# Patient Record
Sex: Female | Born: 1955 | Race: White | Hispanic: No | Smoking: Never smoker
Health system: Southern US, Community
[De-identification: ages and names within clinical notes are randomized; demographics above are authoritative.]

## PROBLEM LIST (undated history)

## (undated) DIAGNOSIS — F0280 Dementia in other diseases classified elsewhere without behavioral disturbance: Secondary | ICD-10-CM

## (undated) DIAGNOSIS — F909 Attention-deficit hyperactivity disorder, unspecified type: Secondary | ICD-10-CM

## (undated) DIAGNOSIS — E039 Hypothyroidism, unspecified: Secondary | ICD-10-CM

## (undated) DIAGNOSIS — R339 Retention of urine, unspecified: Secondary | ICD-10-CM

## (undated) DIAGNOSIS — G309 Alzheimer's disease, unspecified: Secondary | ICD-10-CM

## (undated) HISTORY — PX: APPENDECTOMY: SHX54

## (undated) HISTORY — DX: Dementia in other diseases classified elsewhere, unspecified severity, without behavioral disturbance, psychotic disturbance, mood disturbance, and anxiety: F02.80

## (undated) HISTORY — DX: Attention-deficit hyperactivity disorder, unspecified type: F90.9

## (undated) HISTORY — DX: Dementia in other diseases classified elsewhere, unspecified severity, without behavioral disturbance, psychotic disturbance, mood disturbance, and anxiety: G30.9

---

## 2000-02-27 ENCOUNTER — Encounter: Payer: Self-pay | Admitting: Endocrinology

## 2000-02-27 ENCOUNTER — Encounter: Admission: RE | Admit: 2000-02-27 | Discharge: 2000-02-27 | Payer: Self-pay | Admitting: Endocrinology

## 2001-03-06 ENCOUNTER — Encounter: Admission: RE | Admit: 2001-03-06 | Discharge: 2001-03-06 | Payer: Self-pay | Admitting: Endocrinology

## 2001-03-06 ENCOUNTER — Encounter: Payer: Self-pay | Admitting: Endocrinology

## 2002-02-28 ENCOUNTER — Encounter: Payer: Self-pay | Admitting: Endocrinology

## 2002-02-28 ENCOUNTER — Encounter: Admission: RE | Admit: 2002-02-28 | Discharge: 2002-02-28 | Payer: Self-pay | Admitting: Endocrinology

## 2002-11-28 ENCOUNTER — Encounter: Payer: Self-pay | Admitting: Endocrinology

## 2002-11-28 ENCOUNTER — Encounter: Admission: RE | Admit: 2002-11-28 | Discharge: 2002-11-28 | Payer: Self-pay | Admitting: Endocrinology

## 2003-02-03 ENCOUNTER — Other Ambulatory Visit: Admission: RE | Admit: 2003-02-03 | Discharge: 2003-02-03 | Payer: Self-pay | Admitting: Endocrinology

## 2004-12-26 ENCOUNTER — Encounter: Admission: RE | Admit: 2004-12-26 | Discharge: 2004-12-26 | Payer: Self-pay | Admitting: Endocrinology

## 2006-12-04 ENCOUNTER — Encounter: Admission: RE | Admit: 2006-12-04 | Discharge: 2006-12-04 | Payer: Self-pay | Admitting: Internal Medicine

## 2006-12-05 ENCOUNTER — Other Ambulatory Visit: Admission: RE | Admit: 2006-12-05 | Discharge: 2006-12-05 | Payer: Self-pay | Admitting: Obstetrics and Gynecology

## 2006-12-07 ENCOUNTER — Encounter: Admission: RE | Admit: 2006-12-07 | Discharge: 2006-12-07 | Payer: Self-pay | Admitting: Internal Medicine

## 2007-05-02 ENCOUNTER — Encounter: Admission: RE | Admit: 2007-05-02 | Discharge: 2007-05-02 | Payer: Self-pay | Admitting: Endocrinology

## 2009-08-03 ENCOUNTER — Encounter: Admission: RE | Admit: 2009-08-03 | Discharge: 2009-08-03 | Payer: Self-pay | Admitting: Internal Medicine

## 2010-02-24 ENCOUNTER — Other Ambulatory Visit: Admission: RE | Admit: 2010-02-24 | Discharge: 2010-02-24 | Payer: Self-pay | Admitting: Registered Nurse

## 2010-08-03 ENCOUNTER — Other Ambulatory Visit: Payer: Self-pay | Admitting: Internal Medicine

## 2010-08-03 DIAGNOSIS — Z1231 Encounter for screening mammogram for malignant neoplasm of breast: Secondary | ICD-10-CM

## 2010-08-05 ENCOUNTER — Ambulatory Visit
Admission: RE | Admit: 2010-08-05 | Discharge: 2010-08-05 | Disposition: A | Discharge status: Home / Self Care | Payer: BC Managed Care – PPO | Source: Ambulatory Visit | Attending: Internal Medicine | Admitting: Internal Medicine

## 2010-08-05 DIAGNOSIS — Z1231 Encounter for screening mammogram for malignant neoplasm of breast: Secondary | ICD-10-CM

## 2011-07-18 ENCOUNTER — Other Ambulatory Visit: Payer: Self-pay | Admitting: Internal Medicine

## 2011-07-18 DIAGNOSIS — Z1231 Encounter for screening mammogram for malignant neoplasm of breast: Secondary | ICD-10-CM

## 2011-08-11 ENCOUNTER — Ambulatory Visit
Admission: RE | Admit: 2011-08-11 | Discharge: 2011-08-11 | Disposition: A | Discharge status: Home / Self Care | Payer: BC Managed Care – PPO | Source: Ambulatory Visit | Attending: Internal Medicine | Admitting: Internal Medicine

## 2011-08-11 DIAGNOSIS — Z1231 Encounter for screening mammogram for malignant neoplasm of breast: Secondary | ICD-10-CM

## 2013-03-04 ENCOUNTER — Other Ambulatory Visit (HOSPITAL_COMMUNITY)
Admission: RE | Admit: 2013-03-04 | Discharge: 2013-03-04 | Disposition: A | Discharge status: Home / Self Care | Payer: BC Managed Care – PPO | Source: Ambulatory Visit | Attending: Internal Medicine | Admitting: Internal Medicine

## 2013-03-04 DIAGNOSIS — Z01419 Encounter for gynecological examination (general) (routine) without abnormal findings: Secondary | ICD-10-CM | POA: Insufficient documentation

## 2013-05-04 IMAGING — MG MM DIGITAL SCREENING BILAT W/ CAD
5 series · 5 of 5 positions shown · non-contrast
Comparison: none

DG SCREEN MAMMOGRAM BILATERAL
Bilateral CC and MLO view(s) were taken.
Technologist: Caleb Aujla

DIGITAL SCREENING MAMMOGRAM WITH CAD:
There are scattered fibroglandular densities.  No masses or malignant type calcifications are 
identified.  Compared with prior studies.
Images were processed with CAD.

[R CC (1 of 2)]
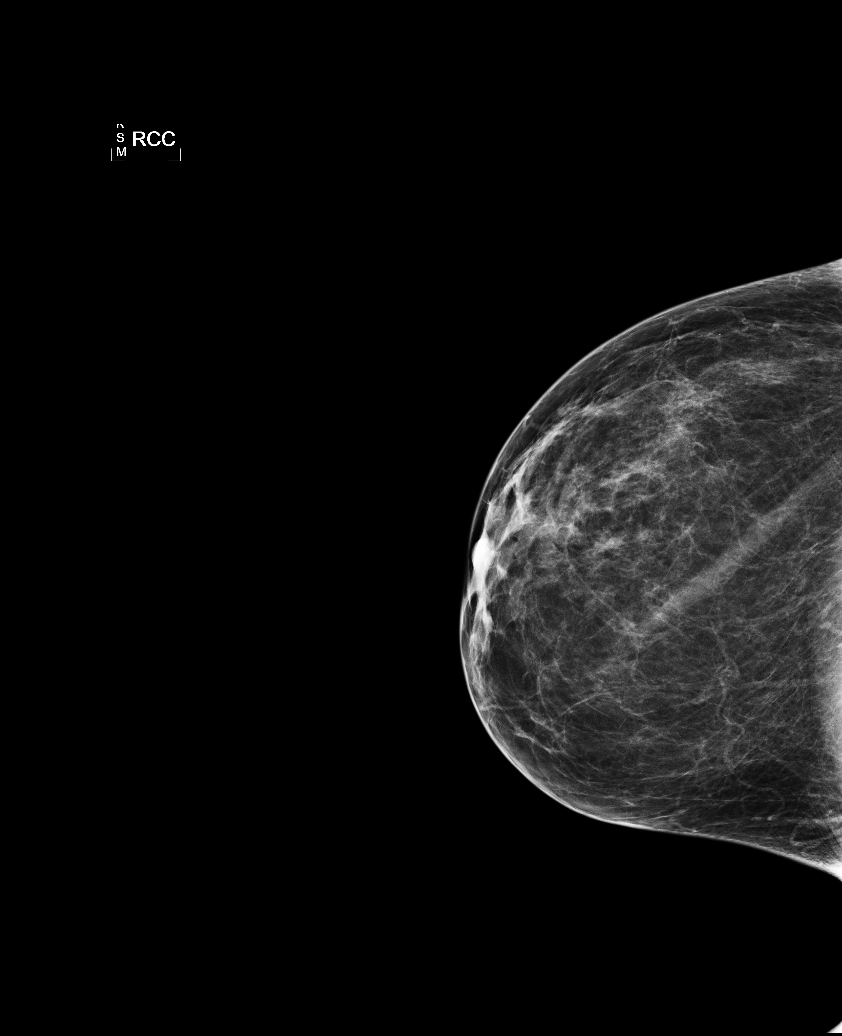

[L CC]
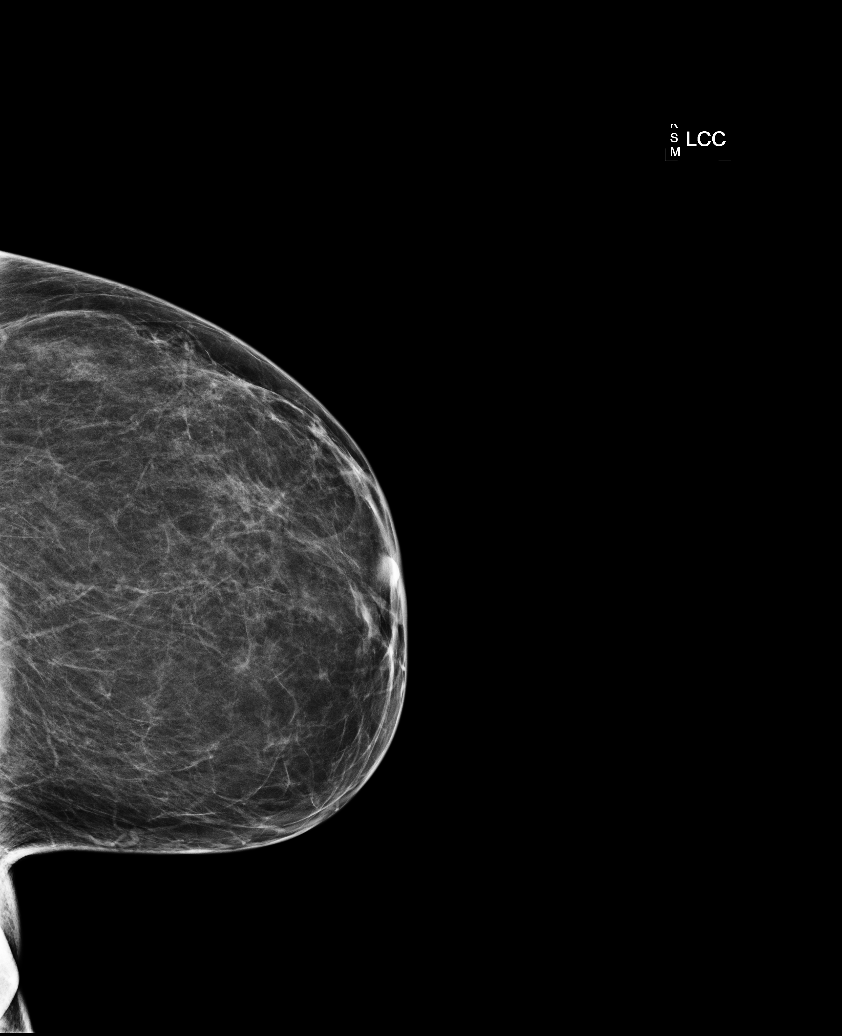

[L MLO]
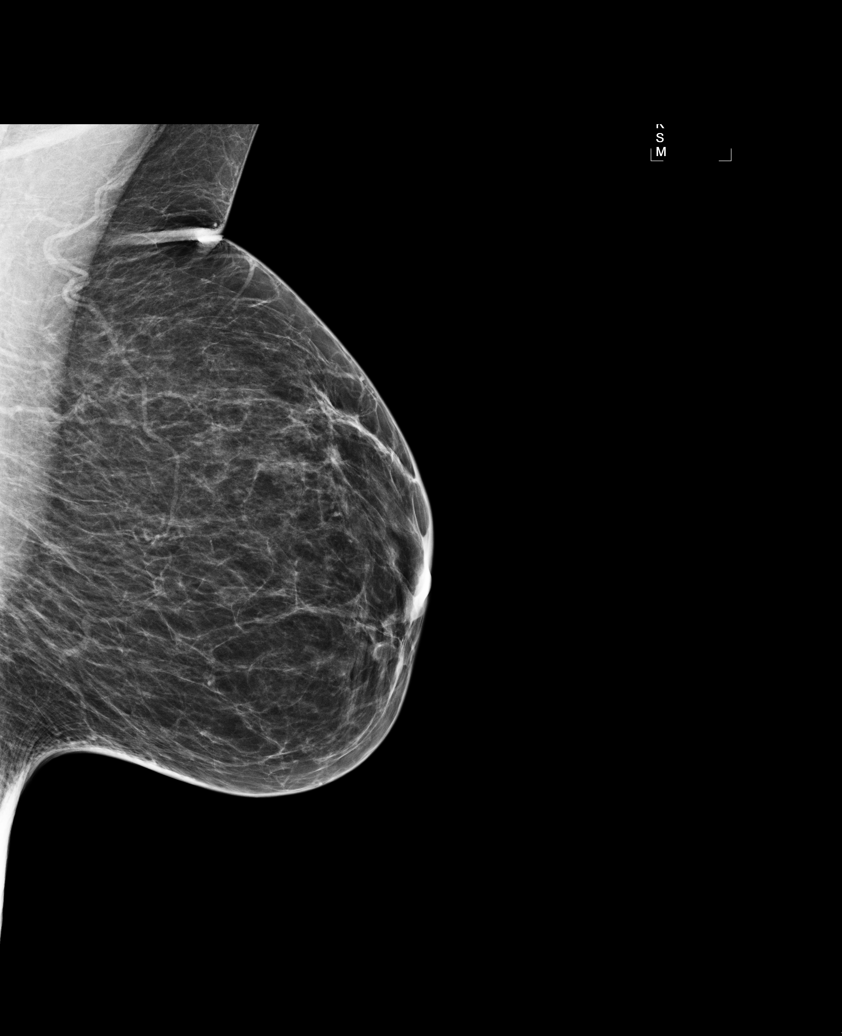

[R MLO]
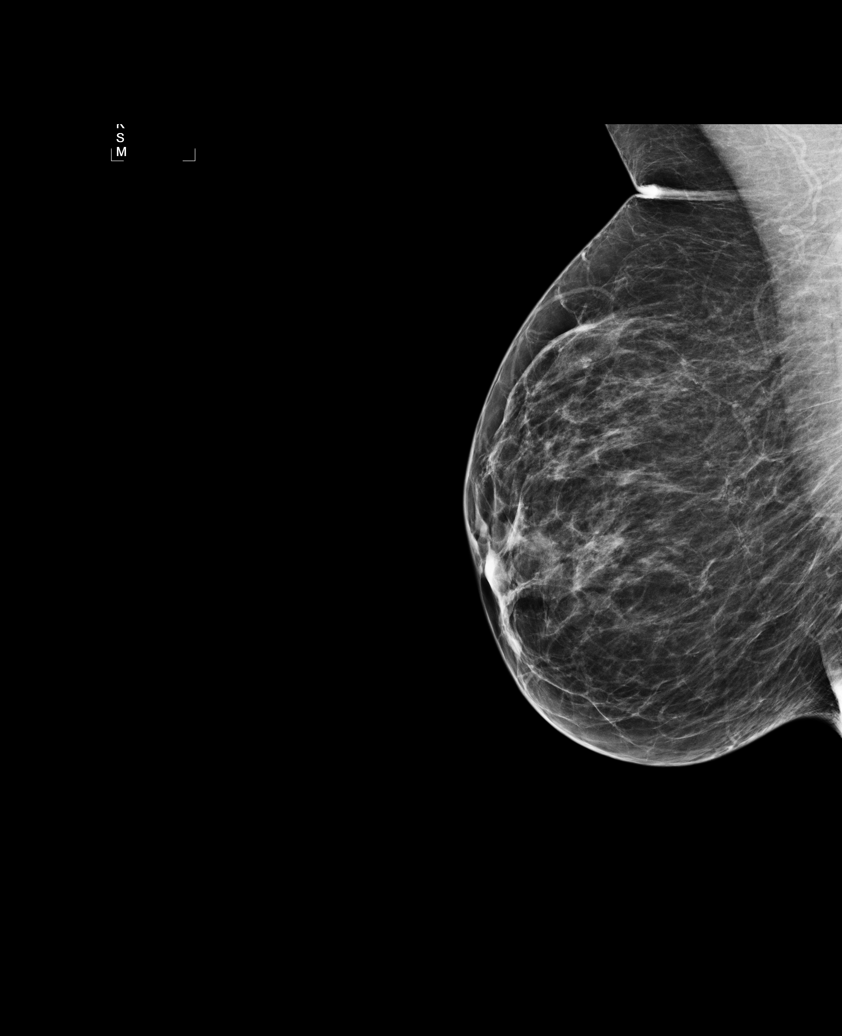

[R CC (2 of 2)]
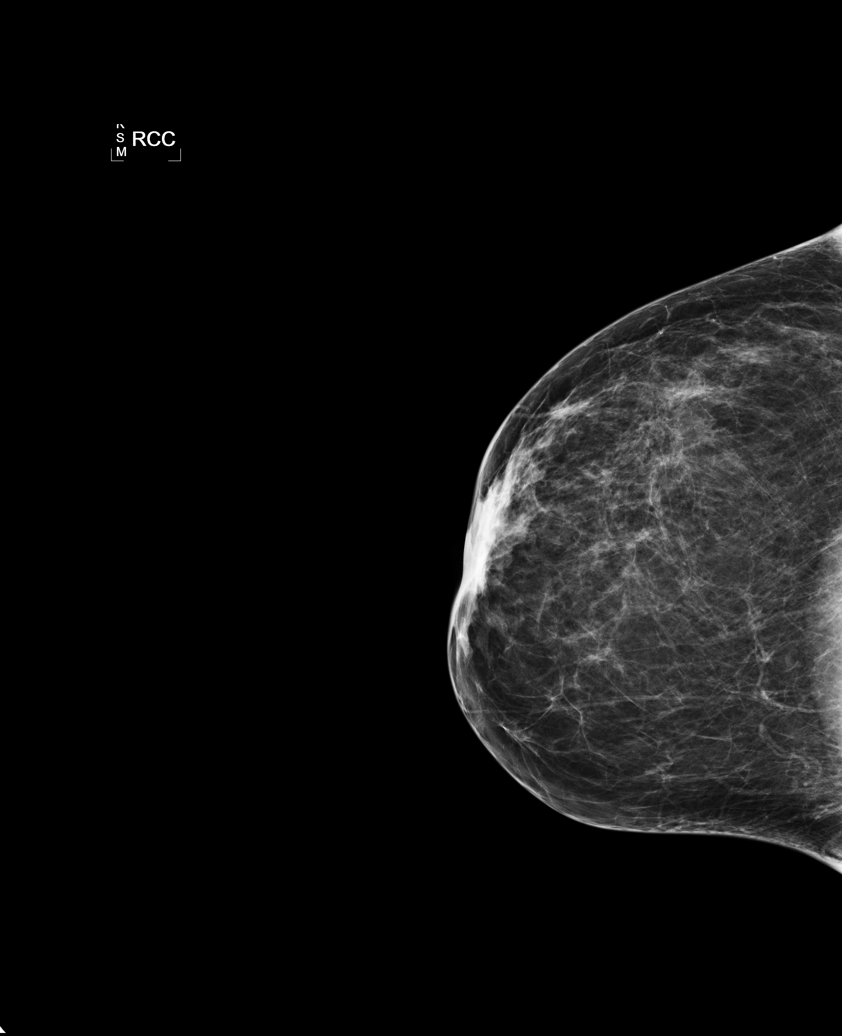

[5 of 5 positions shown; findings below may reference images not displayed]

IMPRESSION: No specific mammographic evidence of malignancy.  Next screening mammogram is recommended in one 
year.

A result letter of this screening mammogram will be mailed directly to the patient.

ASSESSMENT: Negative - BI-RADS 1

Screening mammogram in 1 year.
,

## 2013-07-17 ENCOUNTER — Other Ambulatory Visit: Payer: Self-pay | Admitting: Endocrinology

## 2013-07-17 DIAGNOSIS — E041 Nontoxic single thyroid nodule: Secondary | ICD-10-CM

## 2013-07-23 ENCOUNTER — Ambulatory Visit
Admission: RE | Admit: 2013-07-23 | Discharge: 2013-07-23 | Disposition: A | Discharge status: Home / Self Care | Payer: 59 | Source: Ambulatory Visit | Attending: Endocrinology | Admitting: Endocrinology

## 2013-07-23 DIAGNOSIS — E041 Nontoxic single thyroid nodule: Secondary | ICD-10-CM

## 2013-07-30 ENCOUNTER — Other Ambulatory Visit: Payer: Self-pay | Admitting: Endocrinology

## 2013-07-30 DIAGNOSIS — E041 Nontoxic single thyroid nodule: Secondary | ICD-10-CM

## 2013-08-12 ENCOUNTER — Ambulatory Visit
Admission: RE | Admit: 2013-08-12 | Discharge: 2013-08-12 | Disposition: A | Discharge status: Home / Self Care | Payer: 59 | Source: Ambulatory Visit | Attending: Endocrinology | Admitting: Endocrinology

## 2013-08-12 ENCOUNTER — Other Ambulatory Visit (HOSPITAL_COMMUNITY)
Admission: RE | Admit: 2013-08-12 | Discharge: 2013-08-12 | Disposition: A | Discharge status: Home / Self Care | Payer: 59 | Source: Ambulatory Visit | Attending: Interventional Radiology | Admitting: Interventional Radiology

## 2013-08-12 DIAGNOSIS — E049 Nontoxic goiter, unspecified: Secondary | ICD-10-CM | POA: Insufficient documentation

## 2013-08-12 DIAGNOSIS — E041 Nontoxic single thyroid nodule: Secondary | ICD-10-CM

## 2013-11-22 ENCOUNTER — Emergency Department (HOSPITAL_COMMUNITY): Payer: 59

## 2013-11-22 ENCOUNTER — Emergency Department (HOSPITAL_COMMUNITY)
Admission: EM | Admit: 2013-11-22 | Discharge: 2013-11-22 | Disposition: A | Discharge status: Home / Self Care | Payer: 59 | Attending: Emergency Medicine | Admitting: Emergency Medicine

## 2013-11-22 ENCOUNTER — Encounter (HOSPITAL_COMMUNITY): Payer: Self-pay | Admitting: Emergency Medicine

## 2013-11-22 DIAGNOSIS — Z9089 Acquired absence of other organs: Secondary | ICD-10-CM | POA: Insufficient documentation

## 2013-11-22 DIAGNOSIS — N23 Unspecified renal colic: Secondary | ICD-10-CM

## 2013-11-22 DIAGNOSIS — Z79899 Other long term (current) drug therapy: Secondary | ICD-10-CM | POA: Insufficient documentation

## 2013-11-22 LAB — URINALYSIS, ROUTINE W REFLEX MICROSCOPIC
Bilirubin Urine: NEGATIVE
Glucose, UA: NEGATIVE mg/dL
HGB URINE DIPSTICK: NEGATIVE
Ketones, ur: NEGATIVE mg/dL
Leukocytes, UA: NEGATIVE
NITRITE: NEGATIVE
PROTEIN: NEGATIVE mg/dL
SPECIFIC GRAVITY, URINE: 1.018 (ref 1.005–1.030)
UROBILINOGEN UA: 0.2 mg/dL (ref 0.0–1.0)
pH: 7.5 (ref 5.0–8.0)

## 2013-11-22 LAB — CBC WITH DIFFERENTIAL/PLATELET
BASOS ABS: 0 10*3/uL (ref 0.0–0.1)
BASOS PCT: 0 % (ref 0–1)
EOS ABS: 0.1 10*3/uL (ref 0.0–0.7)
EOS PCT: 1 % (ref 0–5)
HEMATOCRIT: 39.6 % (ref 36.0–46.0)
HEMOGLOBIN: 13.6 g/dL (ref 12.0–15.0)
Lymphocytes Relative: 15 % (ref 12–46)
Lymphs Abs: 1 10*3/uL (ref 0.7–4.0)
MCH: 30.4 pg (ref 26.0–34.0)
MCHC: 34.3 g/dL (ref 30.0–36.0)
MCV: 88.6 fL (ref 78.0–100.0)
MONO ABS: 0.4 10*3/uL (ref 0.1–1.0)
MONOS PCT: 5 % (ref 3–12)
NEUTROS ABS: 5.3 10*3/uL (ref 1.7–7.7)
Neutrophils Relative %: 79 % — ABNORMAL HIGH (ref 43–77)
Platelets: 342 10*3/uL (ref 150–400)
RBC: 4.47 MIL/uL (ref 3.87–5.11)
RDW: 13.7 % (ref 11.5–15.5)
WBC: 6.8 10*3/uL (ref 4.0–10.5)

## 2013-11-22 LAB — COMPREHENSIVE METABOLIC PANEL
ALBUMIN: 4.3 g/dL (ref 3.5–5.2)
ALT: 21 U/L (ref 0–35)
AST: 27 U/L (ref 0–37)
Alkaline Phosphatase: 72 U/L (ref 39–117)
BUN: 15 mg/dL (ref 6–23)
CALCIUM: 10 mg/dL (ref 8.4–10.5)
CHLORIDE: 101 meq/L (ref 96–112)
CO2: 24 mEq/L (ref 19–32)
CREATININE: 0.73 mg/dL (ref 0.50–1.10)
GFR calc Af Amer: >90 mL/min (ref >90)
GFR calc non Af Amer: >90 mL/min (ref >90)
Glucose, Bld: 128 mg/dL — ABNORMAL HIGH (ref 70–99)
Potassium: 4.2 mEq/L (ref 3.7–5.3)
Sodium: 139 mEq/L (ref 137–147)
TOTAL PROTEIN: 8.3 g/dL (ref 6.0–8.3)
Total Bilirubin: 0.5 mg/dL (ref 0.3–1.2)

## 2013-11-22 LAB — I-STAT TROPONIN, ED: TROPONIN I, POC: 0 ng/mL (ref 0.00–0.08)

## 2013-11-22 LAB — LIPASE, BLOOD: LIPASE: 42 U/L (ref 11–59)

## 2013-11-22 MED ORDER — ONDANSETRON 8 MG PO TBDP: 8.0000 mg | ORAL_TABLET | Freq: Three times a day (TID) | ORAL | Status: DC | PRN

## 2013-11-22 MED ORDER — ONDANSETRON HCL 4 MG/2ML IJ SOLN
4.0000 mg | Freq: Once | INTRAMUSCULAR | Status: AC
  Administered 2013-11-22: 4 mg via INTRAVENOUS
  Filled 2013-11-22: qty 2

## 2013-11-22 MED ORDER — TAMSULOSIN HCL 0.4 MG PO CAPS: 0.4000 mg | ORAL_CAPSULE | Freq: Every day | ORAL | Status: DC

## 2013-11-22 MED ORDER — SODIUM CHLORIDE 0.9 % IV SOLN
INTRAVENOUS | Status: DC
  Administered 2013-11-22: 13:00:00 via INTRAVENOUS

## 2013-11-22 MED ORDER — HYDROMORPHONE HCL PF 1 MG/ML IJ SOLN
1.0000 mg | Freq: Once | INTRAMUSCULAR | Status: AC
  Administered 2013-11-22: 1 mg via INTRAVENOUS
  Filled 2013-11-22: qty 1

## 2013-11-22 MED ORDER — KETOROLAC TROMETHAMINE 30 MG/ML IJ SOLN
30.0000 mg | Freq: Once | INTRAMUSCULAR | Status: AC
  Administered 2013-11-22: 30 mg via INTRAVENOUS
  Filled 2013-11-22: qty 1

## 2013-11-22 MED ORDER — OXYCODONE-ACETAMINOPHEN 5-325 MG PO TABS: 2.0000 | ORAL_TABLET | ORAL | Status: DC | PRN

## 2013-11-22 MED ORDER — ONDANSETRON 4 MG PO TBDP
4.0000 mg | ORAL_TABLET | Freq: Once | ORAL | Status: AC
  Administered 2013-11-22: 4 mg via ORAL
  Filled 2013-11-22: qty 1

## 2013-11-22 NOTE — Discharge Instructions (Signed)
Call your urologist on Monday to schedule an appointment to be seen Kidney Stones Kidney stones (urolithiasis) are deposits that form inside your kidneys. The intense pain is caused by the stone moving through the urinary tract. When the stone moves, the ureter goes into spasm around the stone. The stone is usually passed in the urine.  CAUSES   A disorder that makes certain neck glands produce too much parathyroid hormone (primary hyperparathyroidism).  A buildup of uric acid crystals, similar to gout in your joints.  Narrowing (stricture) of the ureter.  A kidney obstruction present at birth (congenital obstruction).  Previous surgery on the kidney or ureters.  Numerous kidney infections. SYMPTOMS   Feeling sick to your stomach (nauseous).  Throwing up (vomiting).  Blood in the urine (hematuria).  Pain that usually spreads (radiates) to the groin.  Frequency or urgency of urination. DIAGNOSIS   Taking a history and physical exam.  Blood or urine tests.  CT scan.  Occasionally, an examination of the inside of the urinary bladder (cystoscopy) is performed. TREATMENT   Observation.  Increasing your fluid intake.  Extracorporeal shock wave lithotripsy This is a noninvasive procedure that uses shock waves to break up kidney stones.  Surgery may be needed if you have severe pain or persistent obstruction. There are various surgical procedures. Most of the procedures are performed with the use of small instruments. Only small incisions are needed to accommodate these instruments, so recovery time is minimized. The size, location, and chemical composition are all important variables that will determine the proper choice of action for you. Talk to your health care provider to better understand your situation so that you will minimize the risk of injury to yourself and your kidney.  HOME CARE INSTRUCTIONS   Drink enough water and fluids to keep your urine clear or pale yellow.  This will help you to pass the stone or stone fragments.  Strain all urine through the provided strainer. Keep all particulate matter and stones for your health care provider to see. The stone causing the pain may be as small as a grain of salt. It is very important to use the strainer each and every time you pass your urine. The collection of your stone will allow your health care provider to analyze it and verify that a stone has actually passed. The stone analysis will often identify what you can do to reduce the incidence of recurrences.  Only take over-the-counter or prescription medicines for pain, discomfort, or fever as directed by your health care provider.  Make a follow-up appointment with your health care provider as directed.  Get follow-up X-rays if required. The absence of pain does not always mean that the stone has passed. It may have only stopped moving. If the urine remains completely obstructed, it can cause loss of kidney function or even complete destruction of the kidney. It is your responsibility to make sure X-rays and follow-ups are completed. Ultrasounds of the kidney can show blockages and the status of the kidney. Ultrasounds are not associated with any radiation and can be performed easily in a matter of minutes. SEEK MEDICAL CARE IF:  You experience pain that is progressive and unresponsive to any pain medicine you have been prescribed. SEEK IMMEDIATE MEDICAL CARE IF:   Pain cannot be controlled with the prescribed medicine.  You have a fever or shaking chills.  The severity or intensity of pain increases over 18 hours and is not relieved by pain medicine.  You develop  a new onset of abdominal pain.  You feel faint or pass out.  You are unable to urinate. MAKE SURE YOU:   Understand these instructions.  Will watch your condition.  Will get help right away if you are not doing well or get worse. Document Released: 05/29/2005 Document Revised: 01/29/2013  Document Reviewed: 10/30/2012 [facility] Patient Information 2014 [place].

## 2013-11-22 NOTE — ED Notes (Signed)
Bed: WA08 Expected date:  Expected time:  Means of arrival:  Comments: 

## 2013-11-22 NOTE — ED Notes (Signed)
Pt co RUQ pain that radiates to her rigth back.  Pt states that she is nauseated now but denies v/d.  Pt states that pain has been going on for past couple of days but got worse today.  Pt not for sure if pain gets worse with eating/drinking.

## 2013-11-22 NOTE — ED Provider Notes (Signed)
CSN: [id number]     Arrival date & time 11/22/13  1053 History   First MD Initiated Contact with Patient 11/22/13 1146     Chief Complaint  Patient presents with  . RQU pain   . Nausea     (Consider location/radiation/quality/duration/timing/severity/associated sxs/prior Treatment) The history is provided by the patient and a relative.   patient here complaining of right upper quadrant pain x3 days. Pain is been colicky and not associated with vomiting but she has had nausea. No fever or chills. No dysuria hematuria. Questionable association with food make her symptoms worse. No prior history of same. No treatment used prior to arrival. Denies any dyspnea or shortness of breath. No chest pain chest pressure.no  Anginal type symptoms  History reviewed. No pertinent past medical history. Past Surgical History  Procedure Laterality Date  . Appendectomy     No family history on file. History  Substance Use Topics  . Smoking status: Never Smoker   . Smokeless tobacco: Never Used  . Alcohol Use: 1.2 oz/week    2 Glasses of wine per week   OB History   Grav Para Term Preterm Abortions TAB SAB Ect Mult Living                 Review of Systems  All other systems reviewed and are negative.     Allergies  Sulfa antibiotics  Home Medications   Prior to Admission medications   Medication Sig Start Date End Date Taking? Authorizing Provider  buPROPion (WELLBUTRIN XL) 300 MG 24 hr tablet Take 300 mg by mouth daily.   Yes Historical Provider, MD  ibuprofen (ADVIL,MOTRIN) 200 MG tablet Take 600 mg by mouth every 6 (six) hours as needed (pain).   Yes Historical Provider, MD  traMADol (ULTRAM) 50 MG tablet Take 50 mg by mouth every 6 (six) hours as needed (pain).   Yes Historical Provider, MD   BP 150/91  Pulse 56  Temp(Src) 97.5 F (36.4 C)  Resp 17  SpO2 100% Physical Exam  Nursing note and vitals reviewed. Constitutional: She is oriented to person, place, and time. She  appears well-developed and well-nourished.  Non-toxic appearance. No distress.  HENT:  Head: Normocephalic and atraumatic.  Eyes: Conjunctivae, EOM and lids are normal. Pupils are equal, round, and reactive to light.  Neck: Normal range of motion. Neck supple. No tracheal deviation present. No mass present.  Cardiovascular: Normal rate, regular rhythm and normal heart sounds.  Exam reveals no gallop.   No murmur heard. Pulmonary/Chest: Effort normal and breath sounds normal. No stridor. No respiratory distress. She has no decreased breath sounds. She has no wheezes. She has no rhonchi. She has no rales.  Abdominal: Soft. Normal appearance and bowel sounds are normal. She exhibits no distension. There is tenderness in the right upper quadrant. There is no rigidity, no rebound, no guarding and no CVA tenderness.    Musculoskeletal: Normal range of motion. She exhibits no edema and no tenderness.  Neurological: She is alert and oriented to person, place, and time. She has normal strength. No cranial nerve deficit or sensory deficit. GCS eye subscore is 4. GCS verbal subscore is 5. GCS motor subscore is 6.  Skin: Skin is warm and dry. No abrasion and no rash noted.  Psychiatric: She has a normal mood and affect. Her speech is normal and behavior is normal.    ED Course  Procedures (including critical care time) Labs Review Labs Reviewed  CBC WITH DIFFERENTIAL  COMPREHENSIVE METABOLIC PANEL  LIPASE, BLOOD  URINALYSIS, ROUTINE W REFLEX MICROSCOPIC  I-STAT TROPOININ, ED    Imaging Review Ct Abdomen Pelvis Wo Contrast  11/22/2013   CLINICAL DATA:  Stone disease.  Right lower quadrant pain.  EXAM: CT ABDOMEN AND PELVIS WITHOUT CONTRAST  TECHNIQUE: Multidetector CT imaging of the abdomen and pelvis was performed following the standard protocol without IV contrast.  COMPARISON:  None.  FINDINGS: Liver normal. Spleen normal. Pancreas normal. No biliary distention. Gallbladder is nondistended.   Adrenals normal. No focal renal cortical abnormality. Mild hydronephrosis noted on the right. The entire course of the ureter is difficult to follow. At approximately 4 mm calcified stone in the mid right ureter cannot be excluded. The bladder is nondistended. Uterus and adnexa unremarkable. No free pelvic fluid.  No adenopathy.  Abdominal aorta normal calibre.  Appendix normal. Stool is noted throughout the colon. There is no bowel distention. No free air. No mesenteric mass. No significant hernia.  Subsegmental atelectasis lung bases. Heart size normal. Degenerative changes lumbar spine and both hips.  IMPRESSION: 1. Probable 4 mm stone mid right ureter with mild hydronephrosis. 2. No acute abnormality otherwise noted.   Electronically Signed   By: Maisie Fushomas  Register   On: 11/22/2013 15:55   [place] Abdomen Complete  11/22/2013   CLINICAL DATA:  Abdominal pain  EXAM: ULTRASOUND ABDOMEN COMPLETE  COMPARISON:  None.  FINDINGS: Gallbladder:  Prominent gallbladder folds are present without focal stones. Wall thickness is within normal limits at 2 mm. There is no sonographic Murphy sign.  Common bile duct:  Diameter: 5 mm, within normal limits.  Liver:  No focal lesion identified. Within normal limits in parenchymal echogenicity.  IVC:  No abnormality visualized.  Pancreas:  Pancreatic duct is mildly dilated at 3 mm. No focal mass lesion is present.  Spleen:  Normal size and echotexture. The maximal size is 8 cm, within normal limits.  Right Kidney:  Length: 10.1 cm. Mild to moderate right-sided hydronephrosis is present. No obstructing mass lesion is present.  Left Kidney:  Length: 11.5 cm, within normal limits. Echogenicity within normal limits. No mass or hydronephrosis visualized.  Abdominal aorta:  No aneurysm visualized.  Other findings:  No ureteral jet is identified on the right. This raises concern for a a ureteral obstruction.  IMPRESSION: 1. Mild to moderate right-sided hydronephrosis an absent right ureteral  jet raises concern for right-sided ureteral obstruction. CT abdomen and pelvis without contrast would be useful for further evaluation. 2. Mild dilation of the pancreatic duct without a focal lesion.   Electronically Signed   By: Gennette Pachris  Mattern M.D.   On: 11/22/2013 14:36     EKG Interpretation   Date/Time:  Saturday November 22 2013 11:38:55 EDT Ventricular Rate:  54 PR Interval:  189 QRS Duration: 130 QT Interval:  466 QTC Calculation: 442 R Axis:   76 Text Interpretation:  Sinus rhythm Right bundle branch block ST elevation,  consider inferior injury Confirmed by Cherese Lozano  MD, Nyx Keady ([id number]) on  11/22/2013 12:04:32 PM      MDM   Final diagnoses:  None    Patient given pain meds here he continues to be in discomfort. Abdominal ultrasound negative for gallstones but positive for hydronephrosis. Will obtain abdominal CT   4:14 PM Abdominal CT shows 4 mm kidney stone. Patient's pain is currently controlled. Will be given urology followup as well as meds for pain to go home  Toy BakerAnthony T Lenox Ladouceur, MD 11/22/13 1615

## 2013-11-22 NOTE — ED Notes (Signed)
PT aware that UA specimen needed, unable to provide sample at this time. RN will continue to monitor.

## 2013-12-01 ENCOUNTER — Ambulatory Visit (HOSPITAL_COMMUNITY): Admission: RE | Admit: 2013-12-01 | Payer: 59 | Source: Ambulatory Visit | Admitting: Urology

## 2013-12-01 ENCOUNTER — Encounter (HOSPITAL_COMMUNITY): Admission: RE | Payer: Self-pay | Source: Ambulatory Visit

## 2013-12-01 SURGERY — LITHOTRIPSY, ESWL
Anesthesia: LOCAL | Laterality: Right

## 2013-12-09 ENCOUNTER — Other Ambulatory Visit: Payer: Self-pay | Admitting: Endocrinology

## 2013-12-09 DIAGNOSIS — E041 Nontoxic single thyroid nodule: Secondary | ICD-10-CM

## 2014-02-24 ENCOUNTER — Encounter: Payer: Self-pay | Admitting: Gynecology

## 2014-03-18 ENCOUNTER — Other Ambulatory Visit: Payer: Self-pay | Admitting: Urology

## 2014-04-17 NOTE — Progress Notes (Signed)
Called Dr. Purnell Shoemakerousin's office to speak with her scheduler Alison StallingShanelle Pender re: putting orders into Epic surgery 04-30-14 pre op 04-24-14, message left and a message was left on her ansnwering machine.  I spoke with a staff member  on Thursday, November05, 2015  Who was  taking her calls and gave the information about the need for orders.

## 2014-04-20 ENCOUNTER — Other Ambulatory Visit: Payer: Self-pay | Admitting: Obstetrics and Gynecology

## 2014-04-23 NOTE — Patient Instructions (Addendum)
Isabel PriceKristine L Koch  04/23/2014   Your procedure is scheduled on:  04/30/2014    Come thru the [facility] Entrance.    Follow the Signs to Short Stay Center at 0630       am  Call this number if you have problems the morning of surgery: [phone]   Remember:   Do not eat food or drink liquids after midnight.   Take these medicines the morning of surgery with A SIP OF WATER: Wellbutrin, , Oxycodone if needed, Protonix ( pantoprazole )    Do not wear jewelry, make-up or nail polish.  Do not wear lotions, powders, or perfumes.  deodorant.  Do not shave 48 hours prior to surgery.   Do not bring valuables to the hospital.  Contacts, dentures or bridgework may not be worn into surgery.  Leave suitcase in the car. After surgery it may be brought to your room.  For patients admitted to the hospital, checkout time is 11:00 AM the day of  discharge.        Please read over the following fact sheets that you were given: , coughing and deep breathing exercises, leg exercises            [place] - Preparing for Surgery Before surgery, you can play an important role.  Because skin is not sterile, your skin needs to be as free of germs as possible.  You can reduce the number of germs on your skin by washing with CHG (chlorahexidine gluconate) soap before surgery.  CHG is an antiseptic cleaner which kills germs and bonds with the skin to continue killing germs even after washing. Please DO NOT use if you have an allergy to CHG or antibacterial soaps.  If your skin becomes reddened/irritated stop using the CHG and inform your nurse when you arrive at Short Stay. Do not shave (including legs and underarms) for at least 48 hours prior to the first CHG shower.  You may shave your face/neck. Please follow these instructions carefully:  1.  Shower with CHG Soap the night before surgery and the  morning of Surgery.  2.  If you choose to wash your hair, wash your hair first as usual with your  normal   shampoo.  3.  After you shampoo, rinse your hair and body thoroughly to remove the  shampoo.                           4.  Use CHG as you would any other liquid soap.  You can apply chg directly  to the skin and wash                       Gently with a scrungie or clean washcloth.  5.  Apply the CHG Soap to your body ONLY FROM THE NECK DOWN.   Do not use on face/ open                           Wound or open sores. Avoid contact with eyes, ears mouth and genitals (private parts).                       Wash face,  Genitals (private parts) with your normal soap.             6.  Wash thoroughly, paying special attention to the area where your  surgery  will be performed.  7.  Thoroughly rinse your body with warm water from the neck down.  8.  DO NOT shower/wash with your normal soap after using and rinsing off  the CHG Soap.                9.  Pat yourself dry with a clean towel.            10.  Wear clean pajamas.            11.  Place clean sheets on your bed the night of your first shower and do not  sleep with pets. Day of Surgery : Do not apply any lotions/deodorants the morning of surgery.  Please wear clean clothes to the hospital/surgery center.  FAILURE TO FOLLOW THESE INSTRUCTIONS MAY RESULT IN THE CANCELLATION OF YOUR SURGERY PATIENT SIGNATURE_________________________________  NURSE SIGNATURE__________________________________  ________________________________________________________________________  WHAT IS A BLOOD TRANSFUSION? Blood Transfusion Information  A transfusion is the replacement of blood or some of its parts. Blood is made up of multiple cells which provide different functions.  Red blood cells carry oxygen and are used for blood loss replacement.  White blood cells fight against infection.  Platelets control bleeding.  Plasma helps clot blood.  Other blood products are available for specialized needs, such as hemophilia or other clotting disorders. BEFORE THE  TRANSFUSION  Who gives blood for transfusions?   Healthy volunteers who are fully evaluated to make sure their blood is safe. This is blood bank blood. Transfusion therapy is the safest it has ever been in the practice of medicine. Before blood is taken from a donor, a complete history is taken to make sure that person has no history of diseases nor engages in risky social behavior (examples are intravenous drug use or sexual activity with multiple partners). The donor's travel history is screened to minimize risk of transmitting infections, such as malaria. The donated blood is tested for signs of infectious diseases, such as HIV and hepatitis. The blood is then tested to be sure it is compatible with you in order to minimize the chance of a transfusion reaction. If you or a relative donates blood, this is often done in anticipation of surgery and is not appropriate for emergency situations. It takes many days to process the donated blood. RISKS AND COMPLICATIONS Although transfusion therapy is very safe and saves many lives, the main dangers of transfusion include:   Getting an infectious disease.  Developing a transfusion reaction. This is an allergic reaction to something in the blood you were given. Every precaution is taken to prevent this. The decision to have a blood transfusion has been considered carefully by your caregiver before blood is given. Blood is not given unless the benefits outweigh the risks. AFTER THE TRANSFUSION  Right after receiving a blood transfusion, you will usually feel much better and more energetic. This is especially true if your red blood cells have gotten low (anemic). The transfusion raises the level of the red blood cells which carry oxygen, and this usually causes an energy increase.  The nurse administering the transfusion will monitor you carefully for complications. HOME CARE INSTRUCTIONS  No special instructions are needed after a transfusion. You may find  your energy is better. Speak with your caregiver about any limitations on activity for underlying diseases you may have. SEEK MEDICAL CARE IF:   Your condition is not improving after your transfusion.  You develop redness or irritation at the intravenous (IV) site.  SEEK IMMEDIATE MEDICAL CARE IF:  Any of the following symptoms occur over the next 12 hours:  Shaking chills.  You have a temperature by mouth above 102 F (38.9 C), not controlled by medicine.  Chest, back, or muscle pain.  People around you feel you are not acting correctly or are confused.  Shortness of breath or difficulty breathing.  Dizziness and fainting.  You get a rash or develop hives.  You have a decrease in urine output.  Your urine turns a dark color or changes to pink, red, or brown. Any of the following symptoms occur over the next 10 days:  You have a temperature by mouth above 102 F (38.9 C), not controlled by medicine.  Shortness of breath.  Weakness after normal activity.  The white part of the eye turns yellow (jaundice).  You have a decrease in the amount of urine or are urinating less often.  Your urine turns a dark color or changes to pink, red, or brown. Document Released: 05/26/2000 Document Revised: 08/21/2011 Document Reviewed: 01/13/2008 [facility] Patient Information 2014 [place], [place].  _______________________________________________________________________

## 2014-04-24 ENCOUNTER — Encounter (HOSPITAL_COMMUNITY)
Admission: RE | Admit: 2014-04-24 | Discharge: 2014-04-24 | Disposition: A | Discharge status: Home / Self Care | Payer: 59 | Source: Ambulatory Visit | Attending: Urology | Admitting: Urology

## 2014-04-24 ENCOUNTER — Encounter (HOSPITAL_COMMUNITY): Payer: Self-pay | Admitting: *Deleted

## 2014-04-24 DIAGNOSIS — Z01812 Encounter for preprocedural laboratory examination: Secondary | ICD-10-CM | POA: Diagnosis present

## 2014-04-24 LAB — BASIC METABOLIC PANEL
Anion gap: 13 (ref 5–15)
BUN: 13 mg/dL (ref 6–23)
CHLORIDE: 102 meq/L (ref 96–112)
CO2: 26 mEq/L (ref 19–32)
Calcium: 9.9 mg/dL (ref 8.4–10.5)
Creatinine, Ser: 0.78 mg/dL (ref 0.50–1.10)
GFR calc non Af Amer: >90 mL/min (ref >90)
Glucose, Bld: 95 mg/dL (ref 70–99)
Potassium: 4.2 mEq/L (ref 3.7–5.3)
Sodium: 141 mEq/L (ref 137–147)

## 2014-04-24 LAB — CBC
HCT: 41.5 % (ref 36.0–46.0)
Hemoglobin: 13.8 g/dL (ref 12.0–15.0)
MCH: 29.3 pg (ref 26.0–34.0)
MCHC: 33.3 g/dL (ref 30.0–36.0)
MCV: 88.1 fL (ref 78.0–100.0)
Platelets: 276 10*3/uL (ref 150–400)
RBC: 4.71 MIL/uL (ref 3.87–5.11)
RDW: 13.3 % (ref 11.5–15.5)
WBC: 4.9 10*3/uL (ref 4.0–10.5)

## 2014-04-24 LAB — ABO/RH: ABO/RH(D): O POS

## 2014-04-24 NOTE — Progress Notes (Signed)
EKG- 11/22/13 EPIC

## 2014-04-29 MED ORDER — GENTAMICIN SULFATE 40 MG/ML IJ SOLN
5.0000 mg/kg | INTRAVENOUS | Status: AC
  Administered 2014-04-30: 280 mg via INTRAVENOUS
  Filled 2014-04-29: qty 7

## 2014-04-29 NOTE — Anesthesia Preprocedure Evaluation (Addendum)
Anesthesia Evaluation  Patient identified by MRN, date of birth, ID band Patient awake    Reviewed: Allergy & Precautions, H&P , NPO status , Patient's Chart, lab work & pertinent test results  Airway Mallampati: II  TM Distance: >3 FB Neck ROM: full    Dental no notable dental hx. (+) Teeth Intact, Dental Advisory Given   Pulmonary neg pulmonary ROS,  breath sounds clear to auscultation  Pulmonary exam normal       Cardiovascular Exercise Tolerance: Good negative cardio ROS  Rhythm:regular Rate:Normal     Neuro/Psych negative neurological ROS  negative psych ROS   GI/Hepatic negative GI ROS, Neg liver ROS,   Endo/Other  negative endocrine ROSHypothyroidism   Renal/GU negative Renal ROS  negative genitourinary   Musculoskeletal   Abdominal   Peds  Hematology negative hematology ROS (+)   Anesthesia Other Findings   Reproductive/Obstetrics negative OB ROS                            Anesthesia Physical Anesthesia Plan  ASA: II  Anesthesia Plan: General   Post-op Pain Management:    Induction: Intravenous  Airway Management Planned: Oral ETT  Additional Equipment:   Intra-op Plan:   Post-operative Plan: Extubation in OR  Informed Consent: I have reviewed the patients History and Physical, chart, labs and discussed the procedure including the risks, benefits and alternatives for the proposed anesthesia with the patient or authorized representative who has indicated his/her understanding and acceptance.   Dental Advisory Given  Plan Discussed with: CRNA and Surgeon  Anesthesia Plan Comments:         Anesthesia Quick Evaluation

## 2014-04-30 ENCOUNTER — Ambulatory Visit (HOSPITAL_COMMUNITY): Payer: 59 | Admitting: Anesthesiology

## 2014-04-30 ENCOUNTER — Observation Stay (HOSPITAL_COMMUNITY)
Admission: RE | Admit: 2014-04-30 | Discharge: 2014-05-01 | Disposition: A | Discharge status: Home / Self Care | Payer: 59 | Source: Ambulatory Visit | Attending: Urology | Admitting: Urology

## 2014-04-30 ENCOUNTER — Encounter (HOSPITAL_COMMUNITY)
Admission: RE | Disposition: A | Discharge status: Home / Self Care | Payer: Self-pay | Source: Ambulatory Visit | Attending: Urology

## 2014-04-30 ENCOUNTER — Ambulatory Visit (HOSPITAL_COMMUNITY): Payer: 59

## 2014-04-30 ENCOUNTER — Encounter (HOSPITAL_COMMUNITY): Payer: Self-pay | Admitting: *Deleted

## 2014-04-30 DIAGNOSIS — E039 Hypothyroidism, unspecified: Secondary | ICD-10-CM | POA: Diagnosis not present

## 2014-04-30 DIAGNOSIS — N819 Female genital prolapse, unspecified: Secondary | ICD-10-CM | POA: Diagnosis present

## 2014-04-30 DIAGNOSIS — F419 Anxiety disorder, unspecified: Secondary | ICD-10-CM | POA: Diagnosis not present

## 2014-04-30 DIAGNOSIS — R159 Full incontinence of feces: Secondary | ICD-10-CM | POA: Diagnosis not present

## 2014-04-30 DIAGNOSIS — Z882 Allergy status to sulfonamides status: Secondary | ICD-10-CM | POA: Diagnosis not present

## 2014-04-30 DIAGNOSIS — N814 Uterovaginal prolapse, unspecified: Principal | ICD-10-CM | POA: Insufficient documentation

## 2014-04-30 HISTORY — PX: VAGINAL PROLAPSE REPAIR: SHX830

## 2014-04-30 HISTORY — DX: Hypothyroidism, unspecified: E03.9

## 2014-04-30 HISTORY — PX: VAGINAL HYSTERECTOMY: SHX2639

## 2014-04-30 LAB — TYPE AND SCREEN
ABO/RH(D): O POS
Antibody Screen: NEGATIVE

## 2014-04-30 SURGERY — SUSPENSION, VAGINAL VAULT
Anesthesia: General

## 2014-04-30 MED ORDER — CEFAZOLIN SODIUM-DEXTROSE 2-3 GM-% IV SOLR
INTRAVENOUS | Status: AC
  Filled 2014-04-30: qty 50

## 2014-04-30 MED ORDER — BISACODYL 10 MG RE SUPP: 10.0000 mg | Freq: Every day | RECTAL | Status: DC | PRN

## 2014-04-30 MED ORDER — PROPOFOL 10 MG/ML IV BOLUS
INTRAVENOUS | Status: AC
  Filled 2014-04-30: qty 20

## 2014-04-30 MED ORDER — ONDANSETRON HCL 4 MG/2ML IJ SOLN
INTRAMUSCULAR | Status: DC | PRN
  Administered 2014-04-30 (×2): 2 mg via INTRAVENOUS
  Administered 2014-04-30: 4 mg via INTRAVENOUS

## 2014-04-30 MED ORDER — ACETAMINOPHEN 10 MG/ML IV SOLN
1000.0000 mg | Freq: Once | INTRAVENOUS | Status: AC
  Administered 2014-04-30: 1000 mg via INTRAVENOUS
  Filled 2014-04-30: qty 100

## 2014-04-30 MED ORDER — FENTANYL CITRATE 0.05 MG/ML IJ SOLN
INTRAMUSCULAR | Status: AC
  Filled 2014-04-30: qty 5

## 2014-04-30 MED ORDER — ESCITALOPRAM OXALATE 10 MG PO TABS
10.0000 mg | ORAL_TABLET | Freq: Every day | ORAL | Status: DC
  Filled 2014-04-30: qty 1

## 2014-04-30 MED ORDER — SODIUM CHLORIDE 0.9 % IJ SOLN: 3.0000 mL | INTRAMUSCULAR | Status: DC | PRN

## 2014-04-30 MED ORDER — PROMETHAZINE HCL 25 MG/ML IJ SOLN
6.2500 mg | INTRAMUSCULAR | Status: DC | PRN
  Administered 2014-04-30: 6.25 mg via INTRAVENOUS

## 2014-04-30 MED ORDER — DEXAMETHASONE SODIUM PHOSPHATE 10 MG/ML IJ SOLN
INTRAMUSCULAR | Status: AC
  Filled 2014-04-30: qty 1

## 2014-04-30 MED ORDER — MIDAZOLAM HCL 2 MG/2ML IJ SOLN
INTRAMUSCULAR | Status: AC
  Filled 2014-04-30: qty 2

## 2014-04-30 MED ORDER — SODIUM CHLORIDE 0.9 % IJ SOLN
INTRAMUSCULAR | Status: AC
  Filled 2014-04-30: qty 10

## 2014-04-30 MED ORDER — PANTOPRAZOLE SODIUM 40 MG PO TBEC
40.0000 mg | DELAYED_RELEASE_TABLET | Freq: Every day | ORAL | Status: DC
  Administered 2014-04-30 – 2014-05-01 (×2): 40 mg via ORAL
  Filled 2014-04-30 (×2): qty 1

## 2014-04-30 MED ORDER — FENTANYL CITRATE 0.05 MG/ML IJ SOLN
INTRAMUSCULAR | Status: DC | PRN
  Administered 2014-04-30 (×5): 50 ug via INTRAVENOUS
  Administered 2014-04-30: 100 ug via INTRAVENOUS
  Administered 2014-04-30 (×3): 50 ug via INTRAVENOUS

## 2014-04-30 MED ORDER — MIDAZOLAM HCL 5 MG/5ML IJ SOLN
INTRAMUSCULAR | Status: DC | PRN
  Administered 2014-04-30 (×2): 1 mg via INTRAVENOUS

## 2014-04-30 MED ORDER — KETOROLAC TROMETHAMINE 30 MG/ML IJ SOLN
INTRAMUSCULAR | Status: DC | PRN
  Administered 2014-04-30: 30 mg via INTRAVENOUS

## 2014-04-30 MED ORDER — BUPROPION HCL ER (XL) 300 MG PO TB24
300.0000 mg | ORAL_TABLET | Freq: Every morning | ORAL | Status: DC
  Filled 2014-04-30: qty 1

## 2014-04-30 MED ORDER — GLYCOPYRROLATE 0.2 MG/ML IJ SOLN
INTRAMUSCULAR | Status: AC
  Filled 2014-04-30: qty 3

## 2014-04-30 MED ORDER — LIDOCAINE-EPINEPHRINE 1 %-1:100000 IJ SOLN
INTRAMUSCULAR | Status: AC
  Filled 2014-04-30: qty 1

## 2014-04-30 MED ORDER — STERILE WATER FOR IRRIGATION IR SOLN
Status: DC | PRN
  Administered 2014-04-30: 3000 mL via INTRAVESICAL

## 2014-04-30 MED ORDER — PROPOFOL 10 MG/ML IV BOLUS
INTRAVENOUS | Status: DC | PRN
  Administered 2014-04-30: 160 mg via INTRAVENOUS

## 2014-04-30 MED ORDER — DIPHENHYDRAMINE HCL 50 MG/ML IJ SOLN: 12.5000 mg | Freq: Four times a day (QID) | INTRAMUSCULAR | Status: DC | PRN

## 2014-04-30 MED ORDER — GLYCOPYRROLATE 0.2 MG/ML IJ SOLN
INTRAMUSCULAR | Status: DC | PRN
  Administered 2014-04-30: .6 mg via INTRAVENOUS

## 2014-04-30 MED ORDER — LACTATED RINGERS IV SOLN
INTRAVENOUS | Status: DC
Start: 2014-04-30 — End: 2014-04-30
  Administered 2014-04-30: 12:00:00 via INTRAVENOUS
  Administered 2014-04-30: 1000 mL via INTRAVENOUS
  Administered 2014-04-30 (×2): via INTRAVENOUS
  Administered 2014-04-30: 1000 mL via INTRAVENOUS

## 2014-04-30 MED ORDER — BUPIVACAINE-EPINEPHRINE 0.25% -1:200000 IJ SOLN
INTRAMUSCULAR | Status: DC | PRN
  Administered 2014-04-30: 30 mL

## 2014-04-30 MED ORDER — CEFAZOLIN SODIUM 1-5 GM-% IV SOLN
1.0000 g | Freq: Three times a day (TID) | INTRAVENOUS | Status: AC
  Administered 2014-04-30 – 2014-05-01 (×2): 1 g via INTRAVENOUS
  Filled 2014-04-30 (×2): qty 50

## 2014-04-30 MED ORDER — PROMETHAZINE HCL 25 MG/ML IJ SOLN
INTRAMUSCULAR | Status: AC
  Filled 2014-04-30: qty 1

## 2014-04-30 MED ORDER — ONDANSETRON HCL 4 MG/2ML IJ SOLN
INTRAMUSCULAR | Status: AC
  Filled 2014-04-30: qty 2

## 2014-04-30 MED ORDER — CEFAZOLIN SODIUM-DEXTROSE 2-3 GM-% IV SOLR: 2.0000 g | INTRAVENOUS | Status: DC

## 2014-04-30 MED ORDER — HYDROMORPHONE HCL 1 MG/ML IJ SOLN: 0.2500 mg | INTRAMUSCULAR | Status: DC | PRN

## 2014-04-30 MED ORDER — CISATRACURIUM BESYLATE 20 MG/10ML IV SOLN
INTRAVENOUS | Status: AC
  Filled 2014-04-30: qty 10

## 2014-04-30 MED ORDER — LIDOCAINE-EPINEPHRINE 1 %-1:100000 IJ SOLN
INTRAMUSCULAR | Status: DC | PRN
  Administered 2014-04-30: 30 mL

## 2014-04-30 MED ORDER — HYDROMORPHONE HCL 2 MG/ML IJ SOLN
INTRAMUSCULAR | Status: AC
  Filled 2014-04-30: qty 1

## 2014-04-30 MED ORDER — ATROPINE SULFATE 0.4 MG/ML IJ SOLN
INTRAMUSCULAR | Status: AC
  Filled 2014-04-30: qty 1

## 2014-04-30 MED ORDER — LIDOCAINE HCL (CARDIAC) 20 MG/ML IV SOLN
INTRAVENOUS | Status: DC | PRN
  Administered 2014-04-30: 75 mg via INTRAVENOUS

## 2014-04-30 MED ORDER — EPHEDRINE SULFATE 50 MG/ML IJ SOLN
INTRAMUSCULAR | Status: AC
  Filled 2014-04-30: qty 1

## 2014-04-30 MED ORDER — MORPHINE SULFATE 2 MG/ML IJ SOLN
2.0000 mg | INTRAMUSCULAR | Status: DC | PRN
  Administered 2014-05-01 (×2): 4 mg via INTRAVENOUS
  Filled 2014-04-30 (×2): qty 2

## 2014-04-30 MED ORDER — NEOSTIGMINE METHYLSULFATE 10 MG/10ML IV SOLN
INTRAVENOUS | Status: AC
  Filled 2014-04-30: qty 1

## 2014-04-30 MED ORDER — DEXAMETHASONE SODIUM PHOSPHATE 10 MG/ML IJ SOLN
INTRAMUSCULAR | Status: DC | PRN
  Administered 2014-04-30: 10 mg via INTRAVENOUS

## 2014-04-30 MED ORDER — ESTRADIOL 0.1 MG/GM VA CREA
TOPICAL_CREAM | VAGINAL | Status: DC | PRN
  Administered 2014-04-30: 1 via VAGINAL

## 2014-04-30 MED ORDER — DIPHENHYDRAMINE HCL 12.5 MG/5ML PO ELIX: 12.5000 mg | ORAL_SOLUTION | Freq: Four times a day (QID) | ORAL | Status: DC | PRN

## 2014-04-30 MED ORDER — LACTATED RINGERS IV SOLN: INTRAVENOUS | Status: DC

## 2014-04-30 MED ORDER — BUPIVACAINE-EPINEPHRINE (PF) 0.25% -1:200000 IJ SOLN
INTRAMUSCULAR | Status: AC
  Filled 2014-04-30: qty 30

## 2014-04-30 MED ORDER — SENNOSIDES-DOCUSATE SODIUM 8.6-50 MG PO TABS
2.0000 | ORAL_TABLET | Freq: Every day | ORAL | Status: DC
  Administered 2014-04-30: 2 via ORAL
  Filled 2014-04-30 (×2): qty 2

## 2014-04-30 MED ORDER — METOCLOPRAMIDE HCL 5 MG/ML IJ SOLN
INTRAMUSCULAR | Status: DC | PRN
  Administered 2014-04-30: 5 mg via INTRAVENOUS

## 2014-04-30 MED ORDER — LIDOCAINE HCL (CARDIAC) 20 MG/ML IV SOLN
INTRAVENOUS | Status: AC
  Filled 2014-04-30: qty 5

## 2014-04-30 MED ORDER — SODIUM CHLORIDE 0.9 % IV SOLN
INTRAVENOUS | Status: DC
  Administered 2014-04-30: 20:00:00 via INTRAVENOUS
  Administered 2014-05-01: 100 mL/h via INTRAVENOUS

## 2014-04-30 MED ORDER — SODIUM CHLORIDE 0.9 % IJ SOLN: 3.0000 mL | Freq: Two times a day (BID) | INTRAMUSCULAR | Status: DC

## 2014-04-30 MED ORDER — ESTRADIOL 0.1 MG/GM VA CREA
TOPICAL_CREAM | VAGINAL | Status: AC
  Filled 2014-04-30: qty 85

## 2014-04-30 MED ORDER — CISATRACURIUM BESYLATE (PF) 10 MG/5ML IV SOLN
INTRAVENOUS | Status: DC | PRN
  Administered 2014-04-30: 4 mg via INTRAVENOUS
  Administered 2014-04-30: 10 mg via INTRAVENOUS

## 2014-04-30 MED ORDER — ESTRADIOL 0.1 MG/GM VA CREA
1.0000 | TOPICAL_CREAM | Freq: Every day | VAGINAL | Status: DC
  Administered 2014-04-30: 1 via VAGINAL
  Filled 2014-04-30: qty 42.5

## 2014-04-30 MED ORDER — NEOSTIGMINE METHYLSULFATE 10 MG/10ML IV SOLN
INTRAVENOUS | Status: DC | PRN
  Administered 2014-04-30: 5 mg via INTRAVENOUS

## 2014-04-30 MED ORDER — SODIUM CHLORIDE 0.9 % IR SOLN
Status: DC | PRN
  Administered 2014-04-30: 500 mL

## 2014-04-30 MED ORDER — OXYCODONE-ACETAMINOPHEN 5-325 MG PO TABS
1.0000 | ORAL_TABLET | ORAL | Status: DC | PRN
  Administered 2014-05-01 (×2): 2 via ORAL
  Filled 2014-04-30 (×2): qty 2

## 2014-04-30 MED ORDER — CEFAZOLIN SODIUM-DEXTROSE 2-3 GM-% IV SOLR
2.0000 g | INTRAVENOUS | Status: AC
  Administered 2014-04-30 (×2): 2 g via INTRAVENOUS

## 2014-04-30 MED ORDER — ESTRADIOL 0.1 MG/GM VA CREA
1.0000 | TOPICAL_CREAM | Freq: Every day | VAGINAL | Status: DC
Start: 2014-05-01 — End: 2014-04-30
  Filled 2014-04-30 (×2): qty 42.5

## 2014-04-30 MED ORDER — SODIUM CHLORIDE 0.9 % IV SOLN: 250.0000 mL | INTRAVENOUS | Status: DC | PRN

## 2014-04-30 MED ORDER — HYDROMORPHONE HCL 1 MG/ML IJ SOLN
INTRAMUSCULAR | Status: DC | PRN
  Administered 2014-04-30 (×2): 1 mg via INTRAVENOUS

## 2014-04-30 MED ORDER — PHENYLEPHRINE HCL 10 MG/ML IJ SOLN
INTRAMUSCULAR | Status: AC
  Filled 2014-04-30: qty 1

## 2014-04-30 MED ORDER — SUCCINYLCHOLINE CHLORIDE 20 MG/ML IJ SOLN
INTRAMUSCULAR | Status: DC | PRN
  Administered 2014-04-30: 100 mg via INTRAVENOUS

## 2014-04-30 MED ORDER — ONDANSETRON HCL 4 MG/2ML IJ SOLN: 4.0000 mg | INTRAMUSCULAR | Status: DC | PRN

## 2014-04-30 SURGICAL SUPPLY — 72 items
ADH SKN CLS APL DERMABOND .7 (GAUZE/BANDAGES/DRESSINGS) ×2
ANCHOR NDL 9/16 CIR SZ 8 (NEEDLE) IMPLANT
ANCHOR NEEDLE 9/16 CIR SZ 8 (NEEDLE) ×4 IMPLANT
BAG DECANTER FOR FLEXI CONT (MISCELLANEOUS) ×4 IMPLANT
BAG URINE DRAINAGE (UROLOGICAL SUPPLIES) ×2 IMPLANT
BAG URO CATCHER STRL LF (DRAPE) ×4 IMPLANT
BLADE HEX COATED 2.75 (ELECTRODE) ×2 IMPLANT
BLADE SURG 15 STRL LF DISP TIS (BLADE) ×2 IMPLANT
BLADE SURG 15 STRL SS (BLADE) ×4
BLADE SURG SZ10 CARB STEEL (BLADE) ×4 IMPLANT
CATH FOLEY 2WAY SLVR  5CC 18FR (CATHETERS) ×2
CATH FOLEY 2WAY SLVR 5CC 18FR (CATHETERS) ×2 IMPLANT
CATH INTERMIT  6FR 70CM (CATHETERS) ×6 IMPLANT
CLOTH BEACON ORANGE TIMEOUT ST (SAFETY) ×4 IMPLANT
COVER MAYO STAND STRL (DRAPES) ×2 IMPLANT
COVER SURGICAL LIGHT HANDLE (MISCELLANEOUS) ×4 IMPLANT
DECANTER SPIKE VIAL GLASS SM (MISCELLANEOUS) ×4 IMPLANT
DERMABOND ADVANCED (GAUZE/BANDAGES/DRESSINGS) ×2
DERMABOND ADVANCED .7 DNX12 (GAUZE/BANDAGES/DRESSINGS) IMPLANT
DEVICE CAPIO SLIM SINGLE (INSTRUMENTS) ×2 IMPLANT
DRAIN PENROSE 18X1/2 LTX STRL (DRAIN) ×4 IMPLANT
DRAPE CAMERA CLOSED 9X96 (DRAPES) ×6 IMPLANT
DRAPE SURG IRRIG POUCH 19X23 (DRAPES) ×2 IMPLANT
DRAPE UTILITY 15X26 (DRAPE) ×4 IMPLANT
ELECT LIGASURE SHORT 9 REUSE (ELECTRODE) ×2 IMPLANT
ELECT REM PT RETURN 9FT ADLT (ELECTROSURGICAL) ×4
ELECTRODE REM PT RTRN 9FT ADLT (ELECTROSURGICAL) IMPLANT
GAUZE SPONGE 4X4 16PLY XRAY LF (GAUZE/BANDAGES/DRESSINGS) ×4 IMPLANT
GLOVE BIO SURGEON STRL SZ 6.5 (GLOVE) ×3 IMPLANT
GLOVE BIO SURGEON STRL SZ7 (GLOVE) ×4 IMPLANT
GLOVE BIO SURGEONS STRL SZ 6.5 (GLOVE) ×1
GLOVE BIOGEL M STRL SZ7.5 (GLOVE) ×12 IMPLANT
GOWN STRL REUS W/TWL XL LVL3 (GOWN DISPOSABLE) ×12 IMPLANT
GUIDEWIRE STR DUAL SENSOR (WIRE) ×6 IMPLANT
KIT BASIN OR (CUSTOM PROCEDURE TRAY) ×4 IMPLANT
KIT SUPRAPUBIC CATH (MISCELLANEOUS) IMPLANT
LIQUID BAND (GAUZE/BANDAGES/DRESSINGS) IMPLANT
MANIFOLD NEPTUNE II (INSTRUMENTS) ×4 IMPLANT
MARKER SKIN DUAL TIP RULER LAB (MISCELLANEOUS) ×6 IMPLANT
MATRISTEM PELVIC FLOOR (Tissue) ×2 IMPLANT
NEEDLE HYPO 22GX1.5 SAFETY (NEEDLE) ×4 IMPLANT
NS IRRIG 1000ML POUR BTL (IV SOLUTION) ×4 IMPLANT
PACK CYSTO (CUSTOM PROCEDURE TRAY) ×4 IMPLANT
PACKING VAGINAL (PACKING) ×2 IMPLANT
PENCIL BUTTON HOLSTER BLD 10FT (ELECTRODE) ×2 IMPLANT
PLUG CATH AND CAP STER (CATHETERS) ×4 IMPLANT
RETRACTOR LONRSTAR 16.6X16.6CM (MISCELLANEOUS) ×2 IMPLANT
RETRACTOR STAY HOOK 5MM (MISCELLANEOUS) ×4 IMPLANT
RETRACTOR STER APS 16.6X16.6CM (MISCELLANEOUS) ×4
SCRUB PCMX 4 OZ (MISCELLANEOUS) ×4 IMPLANT
SET IRRIG Y TYPE TUR BLADDER L (SET/KITS/TRAYS/PACK) ×2 IMPLANT
SHEET LAVH (DRAPES) ×4 IMPLANT
SLEEVE SURGEON STRL (DRAPES) ×4 IMPLANT
SLING LYNX SUPRAPUBIC (Sling) ×2 IMPLANT
SPONGE LAP 4X18 X RAY DECT (DISPOSABLE) ×12 IMPLANT
SUCTION FRAZIER 12FR DISP (SUCTIONS) ×6 IMPLANT
SUT CAPIO ETHIBPND (SUTURE) ×4 IMPLANT
SUT VIC AB 0 CT1 18XCR BRD 8 (SUTURE) IMPLANT
SUT VIC AB 0 CT1 27 (SUTURE) ×16
SUT VIC AB 0 CT1 27XBRD ANTBC (SUTURE) IMPLANT
SUT VIC AB 0 CT1 8-18 (SUTURE) ×8
SUT VIC AB 2-0 UR5 27 (SUTURE) ×4 IMPLANT
SUT VIC AB 2-0 UR6 27 (SUTURE) ×32 IMPLANT
SUT VIC AB 3-0 CT1 36 (SUTURE) ×2 IMPLANT
SUT VICRYL 0 TIES 12 18 (SUTURE) ×2 IMPLANT
SYRINGE 10CC LL (SYRINGE) ×4 IMPLANT
TOWEL NATURAL 10PK STERILE (DISPOSABLE) ×4 IMPLANT
TOWEL OR NON WOVEN STRL DISP B (DISPOSABLE) ×6 IMPLANT
TUBING CONNECTING 10 (TUBING) ×3 IMPLANT
TUBING CONNECTING 10' (TUBING) ×1
WATER STERILE IRR 1500ML POUR (IV SOLUTION) ×2 IMPLANT
YANKAUER SUCT BULB TIP 10FT TU (MISCELLANEOUS) ×4 IMPLANT

## 2014-04-30 NOTE — Anesthesia Procedure Notes (Signed)
Procedure Name: Intubation Date/Time: 04/30/2014 8:47 AM Performed by: Edison PaceGRAY, Jaskirat Schwieger E Pre-anesthesia Checklist: Patient identified, Timeout performed, Emergency Drugs available, Suction available and Patient being monitored Patient Re-evaluated:Patient Re-evaluated prior to inductionOxygen Delivery Method: Circle system utilized Preoxygenation: Pre-oxygenation with 100% oxygen Intubation Type: IV induction and Cricoid Pressure applied Ventilation: Mask ventilation without difficulty Laryngoscope Size: Mac and 4 Grade View: Grade II Tube type: Oral Tube size: 7.5 mm Number of attempts: 1 Airway Equipment and Method: Stylet Placement Confirmation: ETT inserted through vocal cords under direct vision,  positive ETCO2 and breath sounds checked- equal and bilateral Secured at: 21 cm Tube secured with: Tape Dental Injury: Teeth and Oropharynx as per pre-operative assessment  Difficulty Due To: Difficulty was unanticipated Comments: Anterior.  Cords visualized with head lift/cricoid pressure.

## 2014-04-30 NOTE — Op Note (Signed)
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 Dereck LigasSharonette Cousins  2. McCall culdoplasty per Dr. Dereck LigasSharonette  Cousins  3. Kelly plication of cystocele;  4. Sacrospinous fixation of anterior vault and apex, with tissue augmentation implantation with A-Cell. ;  5. Lynx Retropubic sling  Surgeon:  S. Patsi Searsannenbaum, MD, Urology                  Nelta NumbersS. Cousins, OB-GYN First assistant:  Sarina SerWiliam Kirby, MD  Anesthesia:  General LMA  Preparation:  After appropriate preanesthesia, the patient was brought to the operative room, placed on the operating table in the dorsal supine position where general LMA anesthesia was introduced. She was then placed in the dorsal lithotomy position with pubis was prepped with Betadine solution and draped in usual fashion. The pubis was shaven, prepped with Betadine solution, and draped in usual fashion. The history was double checked. Vaginal examination revealed stage IV vaginal wall prolapse.  Review history:  58 yo female, P 3-3-0, with Stress Urinary Incontinence, and stage IV anterior vault prolapse, with co-incidental uterine prolapse. Cough leak point pressure is 51 cm H20 at 300cc. Now for hysterectomy , McColl culdoplasty, and Kelly plication of cystocele, and Sacrospinus fixation of the apex with A-Cell tissue implant; and Lynx p-v sling.   Statement of  Likelihood of Success: Excellent. TIME-OUT observed.:  Procedure:  The patient underwent vaginal hysterectomy per Dr. Rondel Jumboousins, with assistance per Dr. Patsi Searsannenbaum. Following vaginal hysterectomy, and McCall culdoplasty, 30 mL of lidocaine 1% with epinephrine 1-100,000 was injected along the marked incision line 1.57 m proximal to the urethrovaginal junction. Using a 15 blade, a horizontal incision is made and subcutaneous tissue dissected  revealing a large cystocele. Dissection was accomplished both proximal and distal to the incision. Following this, dissection was accomplished into the pelvic floor, and the initial spines were identified bilaterally. The sacrospinous Kenn[facility] Center[place]iggins were identified bilaterally. 2 permanent 0 sutures were then placed through the sacrospinous ligament laterally. Following this, Kelly plication was accomplished using 2-0 Vicryl suture.  A pause in the urology portion of the surgery was accomplished so that [facility] could be accomplished, and following this, measurement was accomplished and a piece of anterior cruciate ligament was cut to fit over the bladder. A 4 7 m x 5 cm portion of anterior cruciate ligament was cut, and placed over the sutures. The portion of tissue was further cut to fit the wound, and the sutures are placed through the tissue. Following this, the tissue was sutured to the bladder, with no folds in the tissue. From this, the incision was closed with running 2-0 Vicryl suture. Minimal bleeding was noted. The apex of the vagina was then closed in longitudinal fashion using running 2-0 Vicryl suture. Attention was directed to the [facility] sutures, which were buried.  Following this, repeat marking of the mid urethra was accomplished, and injection of the mid urethra and the suprapubic sites for [facility] were injected with lidocaine. Following this, incision was made 2 finger breaths above the pubic tubercle and to further was lateral to the pubic tubercle. A 27 m mid urethral incision is made and subcutaneous tissue dissected bilaterally. The needles for the [facility] sl[facility] were passed, and the sling was then passed retropubically. Cystoscopy was a copy, and revealed no evidence of any mesh within the bladder or the urethra. No  sutures within the bladder. The bladder neck moved well. The mesh was tensioned with a large rhino clamp behind the mesh. Sleeves were cut after the plastic  portion was removed, and the sleeves cut subcutaneous A. No tension on the mesh was noted. Urine was noted to emit from both ureteral orifices. Foley catheter was replaced, and the periurethral wound was closed with running 2-0 Vicryl suture. The suprapubic incisions were closed with Dermabond.  The patient received IV Tylenol and IV Toradol. She also received antibiotics during the procedure. She was awakened, and taken to recovery room in good condition.

## 2014-04-30 NOTE — Anesthesia Postprocedure Evaluation (Signed)
  Anesthesia Post-op Note  Patient: Isabel PriceKristine L Leavy  Procedure(s) Performed: Procedure(s) (LRB): ANTERIOR VAULT REPAIR KELLY PLICATION A CELL GRAFT  AUGMETATION ,SACROSPINUS FIXATION AND LYNX SLING  (N/A) HYSTERECTOMY VAGINAL WITH BILATERAL SALPINGECTOMY (Bilateral)  Patient Location: PACU  Anesthesia Type: General  Level of Consciousness: awake and alert   Airway and Oxygen Therapy: Patient Spontanous Breathing  Post-op Pain: mild  Post-op Assessment: Post-op Vital signs reviewed, Patient's Cardiovascular Status Stable, Respiratory Function Stable, Patent Airway and No signs of Nausea or vomiting  Last Vitals:  Filed Vitals:   04/30/14 1445  BP:   Pulse: 69  Temp:   Resp: 18    Post-op Vital Signs: stable   Complications: No apparent anesthesia complications

## 2014-04-30 NOTE — Transfer of Care (Signed)
Immediate Anesthesia Transfer of Care Note  Patient: Isabel PriceKristine L Koch  Procedure(s) Performed: Procedure(s): ANTERIOR VAULT REPAIR KELLY PLICATION A CELL GRAFT  AUGMETATION ,SACROSPINUS FIXATION AND LYNX SLING  (N/A) HYSTERECTOMY VAGINAL WITH BILATERAL SALPINGECTOMY (Bilateral)  Patient Location: PACU  Anesthesia Type:General  Level of Consciousness: awake, alert , oriented, patient cooperative and responds to stimulation  Airway & Oxygen Therapy: Patient Spontanous Breathing and Patient connected to face mask oxygen  Post-op Assessment: Report given to PACU RN, Post -op Vital signs reviewed and stable and Patient moving all extremities  Post vital signs: Reviewed and stable  Complications: No apparent anesthesia complications

## 2014-04-30 NOTE — H&P (Signed)
Isabel Koch is an 58 y.o. female. G3P3 WF PMP not on HRT [facility] due to  Uterovaginal prolapse. Pt underwent evaluation for PMB with sonogram showing thin endometrial stripe. Bleeding thought 2nd to exposure of cervix due to prolapse. (+) SUI Pt declined pessary use. She is sexually active and need to reduce prolapse for intercourse. Pt underwent evaluation by Dr Patsi Searsannenbaum for urological portion of her complaint and will have additional surgical management by him at the time of this surgery.  Pertinent Gynecological History: Menses: post-menopausal Bleeding: post menopausal bleeding Contraception: none DES exposure: denies Blood transfusions: none Sexually transmitted diseases: no past history Previous GYN Procedures: none  Last mammogram: normal Date:  03/19/2014( [facility]) Last pap: normal Date: 2015 OB History: G3, P 3  Menstrual History: Menarche age: n/a No LMP recorded. Patient is postmenopausal.    Past Medical History  Diagnosis Date  . Hypothyroidism   Anxiety Fecal incontinence  Past Surgical History  Procedure Laterality Date  . Appendectomy      History reviewed. No pertinent family history.  Social History:  reports that she has never smoked. She has never used smokeless tobacco. She reports that she drinks about 1.2 oz of alcohol per week. She reports that she does not use illicit drugs.  Allergies:  Allergies  Allergen Reactions  . Sulfa Antibiotics Other (See Comments)    Unknown "needles in mouth" per patient     No prescriptions prior to admission  medication: topical estrace intravaginal cream  Wellbutrin xl 300mg  po qd  Review of Systems  All other systems reviewed and are negative.   There were no vitals taken for this visit. Physical Exam  Constitutional: She is oriented to person, place, and time. She appears well-developed and well-nourished.  HENT:  Head: Atraumatic.  Eyes: EOM are normal.   Neck: Neck supple.  Cardiovascular: Regular rhythm.   Respiratory: Breath sounds normal.  GI: Soft.  Musculoskeletal: Normal range of motion.  Neurological: She is alert and oriented to person, place, and time.  Skin: Skin is warm and dry.  Psychiatric: She has a normal mood and affect.  vulva nl Vagina atrophic mucosa, cystocele Cervix: parous Uterus prolapsed 2-3rd degree Adnexa nonpalp Rectal  Small rectocele  No results found for this or any previous visit (from the past 24 hour(s)).  CBC    Component Value Date/Time   WBC 4.9 04/24/2014 1445   RBC 4.71 04/24/2014 1445   HGB 13.8 04/24/2014 1445   HCT 41.5 04/24/2014 1445   PLT 276 04/24/2014 1445   MCV 88.1 04/24/2014 1445   MCH 29.3 04/24/2014 1445   MCHC 33.3 04/24/2014 1445   RDW 13.3 04/24/2014 1445   LYMPHSABS 1.0 11/22/2013 1142   MONOABS 0.4 11/22/2013 1142   EOSABS 0.1 11/22/2013 1142   BASOSABS 0.0 11/22/2013 1142    BMET    Component Value Date/Time   NA 141 04/24/2014 1445   K 4.2 04/24/2014 1445   CL 102 04/24/2014 1445   CO2 26 04/24/2014 1445   GLUCOSE 95 04/24/2014 1445   BUN 13 04/24/2014 1445   CREATININE 0.78 04/24/2014 1445   CALCIUM 9.9 04/24/2014 1445   GFRNONAA >90 04/24/2014 1445   GFRAA >90 04/24/2014 1445     Assessment/Plan: Uterovaginal prolapse Cystocele SUI P) TVH, bilateral salpingectomy. Procedure explained. Risk of surgery includes infection, bleeding, poss need for blood transfusion and its  Risk( HIV, hepatitis, fever, rash), injury to surrounding organ structure, inability to get  to tubes, poss need to convert to open case. All ? answered  Ross Hefferan A 04/30/2014, 4:37 AM

## 2014-04-30 NOTE — H&P (Signed)
H&P  Chief Complaint: Pelvic floor prolapse  History of Present Illness: Izola PriceKristine L Donati is a 58 y.o. female with stage III anterior vault prolapse.  Here today for join case with GYN team.  Will perform abdominal hysterectomy and BSO.  We will concomitantly perform vault suspension and repair or prolapse with sling for stress incontinence.  Please see note in all-scripts for full details.    Past Medical History  Diagnosis Date  . Hypothyroidism     Past Surgical History  Procedure Laterality Date  . Appendectomy      Home Medications:    Medication List    ASK your doctor about these medications        buPROPion 300 MG 24 hr tablet  Commonly known as:  WELLBUTRIN XL  Take 300 mg by mouth every morning.     escitalopram 10 MG tablet  Commonly known as:  LEXAPRO  Take 10 mg by mouth daily. Patient takes at nite when remembers to take     glucosamine-chondroitin 500-400 MG tablet  Take 3 tablets by mouth every morning.     ibuprofen 200 MG tablet  Commonly known as:  ADVIL,MOTRIN  Take 600 mg by mouth every 6 (six) hours as needed (pain).     ondansetron 8 MG disintegrating tablet  Commonly known as:  ZOFRAN ODT  Take 1 tablet (8 mg total) by mouth every 8 (eight) hours as needed for nausea or vomiting.     OVER THE COUNTER MEDICATION  Multi-probiotic 4000 - 1 capsule daily     OVER THE COUNTER MEDICATION  Chelated magnesium  200 mcg - one tablet daily at nite     OVER THE COUNTER MEDICATION  Vitamin D3  2000 IU - one daily     oxyCODONE-acetaminophen 5-325 MG per tablet  Commonly known as:  PERCOCET/ROXICET  Take 2 tablets by mouth every 4 (four) hours as needed for severe pain.     pantoprazole 40 MG tablet  Commonly known as:  PROTONIX  Take 40 mg by mouth daily.     tamsulosin 0.4 MG Caps capsule  Commonly known as:  FLOMAX  Take 1 capsule (0.4 mg total) by mouth daily.     traMADol 50 MG tablet  Commonly known as:  ULTRAM  Take 50 mg by  mouth every 6 (six) hours as needed (pain).     VITAMIN B-12 PO  Take 1 capsule by mouth every morning.        Allergies:  Allergies  Allergen Reactions  . Sulfa Antibiotics Other (See Comments)    Unknown "needles in mouth" per patient     History reviewed. No pertinent family history.  Social History:  reports that she has never smoked. She has never used smokeless tobacco. She reports that she drinks about 1.2 oz of alcohol per week. She reports that she does not use illicit drugs.  ROS: A complete review of systems was performed.  All systems are negative except for pertinent findings as noted.  Physical Exam:  Vital signs in last 24 hours: Temp:  [97.8 F (36.6 C)] 97.8 F (36.6 C) (11/19 0629) Pulse Rate:  [84] 84 (11/19 0629) Resp:  [18] 18 (11/19 0629) BP: (118)/(70) 118/70 mmHg (11/19 0629) SpO2:  [99 %] 99 % (11/19 0629) Weight:  [66.679 kg (147 lb)] 66.679 kg (147 lb) (11/19 21300646) Constitutional:  Alert and oriented, No acute distress Cardiovascular: Regular rate and rhythm, No JVD Respiratory: Normal respiratory effort, Lungs clear bilaterally GI:  Abdomen is soft, nontender, nondistended, no abdominal masses GU: per HPI Lymphatic: No lymphadenopathy Neurologic: Grossly intact, no focal deficits Psychiatric: Normal mood and affect  Laboratory Data:  No results for input(s): WBC, HGB, HCT, PLT in the last 72 hours.  No results for input(s): NA, K, CL, GLUCOSE, BUN, CALCIUM, CREATININE in the last 72 hours.  Invalid input(s): CO3   No results found for this or any previous visit (from the past 24 hour(s)). No results found for this or any previous visit (from the past 240 hour(s)).  Renal Function:  Recent Labs  04/24/14 1445  CREATININE 0.78   Estimated Creatinine Clearance: 67 mL/min (by C-G formula based on Cr of 0.78).  Radiologic Imaging: No results found.  Impression/Assessment:  Izola PriceKristine L Dumais is a 58 y.o. female with stage III  anterior vault prolapse.  Here today for join case with GYN team.  Will perform abdominal hysterectomy and BSO.  We will concomitantly perform vault suspension and repair or prolapse with sling for stress incontinence.  Plan:  To OR today for above repair

## 2014-04-30 NOTE — Brief Op Note (Signed)
04/30/2014  11:42 AM  PATIENT:  Isabel Koch  58 y.o. female  PRE-OPERATIVE DIAGNOSIS:  PELVIC FLOOR PROLAPSE MIXED INCONTINENCE,THIRD DEGREE Uterovaginal  PROLAPSE,.STRESS URINARY INCONTINENCE  POST-OPERATIVE DIAGNOSIS:  PELVIC FLOOR PROLAPSE MIXED INCONTINENCE,THIRD  Degree uterovaginal prolapse,  Pelvic  FLOOR PROLAPSE   PROCEDURE:  Total vaginal hysterectomy, bilateral salpingectomy, McCall cul de plasty  SURGEON:  Surgeon(s) and Role: Panel 2:    * Kathi LudwigSigmund I Tannenbaum, MD - Primary  Panel 1:    * Serita KyleSheronette A Mirely Pangle, MD - Primary  PHYSICIAN ASSISTANT:   ASSISTANTS: Jethro BolusSigmund Tannenbaum, MD   ANESTHESIA:   general Findings, prolapsed uterus outside vagina, atrophic vaginal mucosa, nl tubes, atrophic ovaries EBL:  Total I/O In: 1000 [I.V.:1000] Out: 225 [Urine:175; Blood:50]  BLOOD ADMINISTERED:none  DRAINS: none   LOCAL MEDICATIONS USED:  LIDOCAINE  With epinephrine  SPECIMEN:  Source of Specimen:  uterus with cervix, tubes  DISPOSITION OF SPECIMEN:  PATHOLOGY  COUNTS:  YES  TOURNIQUET:  * No tourniquets in log *  DICTATION: .Other Dictation: Dictation Number [id number]  PLAN OF CARE: Admit for overnight observation  PATIENT DISPOSITION:  PACU - hemodynamically stable.   Delay start of Pharmacological VTE agent (>24hrs) due to surgical blood loss or risk of bleeding: no

## 2014-04-30 NOTE — Progress Notes (Signed)
GYN;  Pt seen for routine postop visit s/p TVH bilateral salpingectomy Nursing reports vaginal packing almost out.  On exam , blood soaked packing almost out of vagina and pad with ~150 cc blood . Packing removed Vulvar area and thigh cleaned up.  Speculum exam did not reveal any further active bleeding.  2 inch estrace soaked gauze reinserted in vagina using sterile technique  Urol Dr Craige CottaKirby on call notified to pass on event to Dr Patsi Searstannenbaum

## 2014-05-01 ENCOUNTER — Encounter (HOSPITAL_COMMUNITY): Payer: Self-pay | Admitting: Urology

## 2014-05-01 DIAGNOSIS — N814 Uterovaginal prolapse, unspecified: Secondary | ICD-10-CM | POA: Diagnosis not present

## 2014-05-01 LAB — CBC
HCT: 31.7 % — ABNORMAL LOW (ref 36.0–46.0)
HCT: 34.5 % — ABNORMAL LOW (ref 36.0–46.0)
HEMOGLOBIN: 11.6 g/dL — AB (ref 12.0–15.0)
Hemoglobin: 10.6 g/dL — ABNORMAL LOW (ref 12.0–15.0)
MCH: 29.4 pg (ref 26.0–34.0)
MCH: 29.7 pg (ref 26.0–34.0)
MCHC: 33.4 g/dL (ref 30.0–36.0)
MCHC: 33.6 g/dL (ref 30.0–36.0)
MCV: 88.1 fL (ref 78.0–100.0)
MCV: 88.5 fL (ref 78.0–100.0)
PLATELETS: 206 10*3/uL (ref 150–400)
Platelets: 192 10*3/uL (ref 150–400)
RBC: 3.6 MIL/uL — AB (ref 3.87–5.11)
RBC: 3.9 MIL/uL (ref 3.87–5.11)
RDW: 13.5 % (ref 11.5–15.5)
RDW: 13.8 % (ref 11.5–15.5)
WBC: 9.8 10*3/uL (ref 4.0–10.5)
WBC: 8 10*3/uL (ref 4.0–10.5)

## 2014-05-01 LAB — BASIC METABOLIC PANEL
ANION GAP: 7 (ref 5–15)
BUN: 12 mg/dL (ref 6–23)
CHLORIDE: 105 meq/L (ref 96–112)
CO2: 26 meq/L (ref 19–32)
Calcium: 8.6 mg/dL (ref 8.4–10.5)
Creatinine, Ser: 0.8 mg/dL (ref 0.50–1.10)
GFR calc Af Amer: >90 mL/min (ref >90)
GFR calc non Af Amer: 80 mL/min — ABNORMAL LOW (ref >90)
Glucose, Bld: 105 mg/dL — ABNORMAL HIGH (ref 70–99)
Potassium: 4.5 mEq/L (ref 3.7–5.3)
Sodium: 138 mEq/L (ref 137–147)

## 2014-05-01 MED ORDER — TAMSULOSIN HCL 0.4 MG PO CAPS: 0.4000 mg | ORAL_CAPSULE | Freq: Every day | ORAL | Status: DC

## 2014-05-01 MED ORDER — MELOXICAM 15 MG PO TABS: 15.0000 mg | ORAL_TABLET | Freq: Every day | ORAL | Status: DC

## 2014-05-01 MED ORDER — OXYCODONE-ACETAMINOPHEN 5-325 MG PO TABS: 1.0000 | ORAL_TABLET | ORAL | Status: DC | PRN

## 2014-05-01 MED ORDER — SODIUM CHLORIDE 0.9 % IV BOLUS (SEPSIS)
500.0000 mL | Freq: Once | INTRAVENOUS | Status: AC
  Administered 2014-05-01: 500 mL via INTRAVENOUS

## 2014-05-01 MED ORDER — ESTRADIOL 0.1 MG/GM VA CREA: 1.0000 | TOPICAL_CREAM | Freq: Every day | VAGINAL | Status: DC

## 2014-05-01 MED ORDER — DIAZEPAM 5 MG PO TABS: 2.5000 mg | ORAL_TABLET | Freq: Two times a day (BID) | ORAL | Status: DC

## 2014-05-01 MED ORDER — ESCITALOPRAM OXALATE 10 MG PO TABS
10.0000 mg | ORAL_TABLET | Freq: Every day | ORAL | Status: DC
  Filled 2014-05-01: qty 1

## 2014-05-01 MED ORDER — CIPROFLOXACIN HCL 500 MG PO TABS: 500.0000 mg | ORAL_TABLET | Freq: Two times a day (BID) | ORAL | Status: DC

## 2014-05-01 NOTE — Plan of Care (Signed)
Problem: Phase I Progression Outcomes Goal: Pain controlled with appropriate interventions Outcome: Progressing Goal: OOB as tolerated unless otherwise ordered Outcome: Progressing Goal: Tubes/drains patent Outcome: Not Applicable Date Met:  46/50/35

## 2014-05-01 NOTE — Plan of Care (Signed)
Problem: Phase I Progression Outcomes Goal: Tubes/drains patent Outcome: Completed/Met Date Met:  05/01/14 Pt has Foley

## 2014-05-01 NOTE — Progress Notes (Signed)
Discharge to home, with Foley catheter in place. D/c instructions and follow up  Appointments done and was given to the patient, verbalized  Understanding. PIV removed by NT no s/s of infiltration or swelling noted.

## 2014-05-01 NOTE — Discharge Summary (Signed)
Date of admission: 04/30/2014  Date of discharge: 05/01/2014  Admission diagnosis: Pelvic floor prolapse  Discharge diagnosis: Same  Secondary diagnoses: None  History and Physical: For full details, please see admission history and physical. Briefly, Isabel Koch is a 58 y.o. year old patient with history of pelvic floor prolapse.  Please see admission H&P and most recent clinic note for full details.   Hospital Course: Patient taken to OR on 04/30/14 for transvaginal hysterectomy and sacrospinous fixation of anterior vault and apex, with tissue augmentation implantation with A-Cell which she tolerated well.  Diet was advanced and she ate well.  She failed her trial of void in the morning of POD#1 and foley catheter was reinserted.    Plan is for her to be discharged with foley and return on Monday for trial of void.  Laboratory values:  Recent Labs  05/01/14 0425 05/01/14 1348  HGB 10.6* 11.6*  HCT 31.7* 34.5*    Recent Labs  05/01/14 0425  CREATININE 0.80    Disposition: Home  Discharge medications:    Medication List    TAKE these medications        buPROPion 300 MG 24 hr tablet  Commonly known as:  WELLBUTRIN XL  Take 300 mg by mouth every morning.     ciprofloxacin 500 MG tablet  Commonly known as:  CIPRO  Take 1 tablet (500 mg total) by mouth 2 (two) times daily.     diazepam 5 MG tablet  Commonly known as:  VALIUM  Take 0.5 tablets (2.5 mg total) by mouth 2 (two) times daily.     escitalopram 10 MG tablet  Commonly known as:  LEXAPRO  Take 10 mg by mouth daily. Patient takes at nite when remembers to take     estradiol 0.1 MG/GM vaginal cream  Commonly known as:  ESTRACE VAGINAL  Place 1 Applicatorful vaginally at bedtime.     glucosamine-chondroitin 500-400 MG tablet  Take 3 tablets by mouth every morning.     ibuprofen 200 MG tablet  Commonly known as:  ADVIL,MOTRIN  Take 600 mg by mouth every 6 (six) hours as needed (pain).     meloxicam 15 MG tablet  Commonly known as:  MOBIC  Take 1 tablet (15 mg total) by mouth daily.     ondansetron 8 MG disintegrating tablet  Commonly known as:  ZOFRAN ODT  Take 1 tablet (8 mg total) by mouth every 8 (eight) hours as needed for nausea or vomiting.     OVER THE COUNTER MEDICATION  Multi-probiotic 4000 - 1 capsule daily     OVER THE COUNTER MEDICATION  Chelated magnesium  200 mcg - one tablet daily at nite     OVER THE COUNTER MEDICATION  Vitamin D3  2000 IU - one daily     oxyCODONE-acetaminophen 5-325 MG per tablet  Commonly known as:  PERCOCET/ROXICET  Take 2 tablets by mouth every 4 (four) hours as needed for severe pain.     oxyCODONE-acetaminophen 5-325 MG per tablet  Commonly known as:  ROXICET  Take 1 tablet by mouth every 4 (four) hours as needed for severe pain.     pantoprazole 40 MG tablet  Commonly known as:  PROTONIX  Take 40 mg by mouth daily.     tamsulosin 0.4 MG Caps capsule  Commonly known as:  FLOMAX  Take 1 capsule (0.4 mg total) by mouth daily.     tamsulosin 0.4 MG Caps capsule  Commonly known as:  FLOMAX  Take 1 capsule (0.4 mg total) by mouth daily.     traMADol 50 MG tablet  Commonly known as:  ULTRAM  Take 50 mg by mouth every 6 (six) hours as needed (pain).     VITAMIN B-12 PO  Take 1 capsule by mouth every morning.        Followup:  Patient to return to clinic on Monday for followup with Dr. Patsi Searsannenbaum for TOV.

## 2014-05-01 NOTE — Discharge Instructions (Signed)
1. Activity:  You are encouraged to ambulate frequently (about every hour during waking hours) to help prevent blood clots from forming in your legs or lungs.  However, you should not engage in any heavy lifting (> 10-15 lbs), strenuous activity, or straining. 2. Diet: You should advance your diet as instructed by your physician.  It will be normal to have some bloating, nausea, and abdominal discomfort intermittently. 3. Prescriptions:  You will be provided a prescription for pain medication to take as needed.  If your pain is not severe enough to require the prescription pain medication, you may take extra strength Tylenol instead which will have less side effects.  You should also take a stool softener to avoid straining with bowel movements as the prescription pain medication may constipate you. 4. You may start showering (but not soaking or bathing in water) the 2nd day after surgery and the incisions simply need to be patted dry after the shower.  No additional care is needed. 5. What to call us about: You should call the office [phone]) if you develop fever > 101 or develop persistent vomiting.

## 2014-05-01 NOTE — Progress Notes (Signed)
Subjective: Patient reports incisional pain and tolerating PO just trying to void this am.   Packing out . Scant red blood noted.  Objective: I have reviewed patient's vital signs.  vital signs, intake and output and labs. Filed Vitals:   05/01/14 0553  BP: 92/57  Pulse:   Temp:   Resp:    I/O last 3 completed shifts: In: 5143.3 [P.O.:120; I.V.:4473.3; IV Piggyback:550] Out: 1625 [Urine:1525; Blood:100]    Lab Results  Component Value Date   WBC 9.8 05/01/2014   HGB 10.6* 05/01/2014   HCT 31.7* 05/01/2014   MCV 88.1 05/01/2014   PLT 206 05/01/2014   Lab Results  Component Value Date   CREATININE 0.80 05/01/2014    EXAM General: alert, cooperative and no distress Resp: clear to auscultation bilaterally Cardio: regular rate and rhythm, S1, S2 normal, no murmur, click, rub or gallop GI: soft, non-tender; bowel sounds normal; no masses,  no organomegaly and incision: intact w/o erythema Extremities: Homans sign is negative, no sign of DVT Vaginal Bleeding: minimal  Assessment: s/p Procedure(s): s/ TVH, bilateral salpingectomy ANTERIOR VAULT REPAIR KELLY PLICATION A CELL GRAFT  AUGMETATION ,SACROSPINUS FIXATION AND LYNX SLING  HYSTERECTOMY VAGINAL WITH BILATERAL SALPINGECTOMY: stable, tolerating diet and anemia 2_postop anemia Plan: Advance diet Encourage ambulation Discharge home per urology plans Voiding trial in progress F/u 6 weeks D/c instructions reviewed with pt and sister. Recommend f/u 6 week gyn Estrace vag cream 1 gm per vagina at hs until postop gyn visit  LOS: 1 day    Chizuko Trine A, MD 05/01/2014 8:33 AM    05/01/2014, 8:33 AM

## 2014-05-01 NOTE — Plan of Care (Signed)
Problem: Phase I Progression Outcomes Goal: Voiding-avoid urinary catheter unless indicated Outcome: Not Applicable Date Met:  61/25/48 Patient will be discharge with Foley Catheter

## 2014-05-01 NOTE — Progress Notes (Signed)
UR completed 

## 2014-05-01 NOTE — Progress Notes (Signed)
PAtient voided 30cc, complaints of discomfort, bladder scan done , retaining 999cc.

## 2014-05-01 NOTE — Op Note (Signed)
NAMMarland Kitchen:  Terressa KoyanagiSTRATTON, Luretta           ACCOUNT NO.:  [id number]  MEDICAL RECORD NO.:  [id number]  LOCATION:  1437                         [facility]  PHYSICIAN:  Maxie BetterSheronette Adelisa Satterwhite, M.D.DATE OF BIRTH:  1955/10/05  DATE OF PROCEDURE:  04/30/2014 DATE OF DISCHARGE:                              OPERATIVE REPORT   PREOPERATIVE DIAGNOSES:  Third-degree uterovaginal prolapse, cystocele, stress urinary incontinence.  PROCEDURE:  Total vaginal hysterectomy,bilateral salpingectomy.  POSTOPERATIVE DIAGNOSES:  Third-degree uterovaginal prolapse, cystocele, stress urinary incontinence.  ANESTHESIA:  General.  SURGEON:  Maxie BetterSheronette Saory Carriero, MD  ASSISTANT:  Sigmund I. Patsi Searsannenbaum, MD  DESCRIPTION OF PROCEDURE:  Under adequate general anesthesia, the patient was placed in the dorsal lithotomy position.  Examination under anesthesia revealed a third-degree prolapse uterus with a very parous cervix, atrophy of the vaginal mucosa.  No adnexal masses could be appreciated.  The patient was sterilely prepped and draped in the usual fashion.  An indwelling Foley catheter was placed.  A weighted speculum was placed in the vagina.  Jacobson clamps were placed in the anterior and posterior lip of the cervix.  The anticipated cervicovaginal junction was injected with 1:100,000 lidocaine with epinephrine.  A circumferential incision was then made at that junction with the Mayo scissors.  Posteriorly, the posterior cul-de-sac was entered and extended transversely.  The vaginal cuff was then removed with 0 Vicryl running lock stitch.  The weighted speculum was then adjusted to be inserted into the abdominal cavity.  The uterosacrals were bilaterally clamped, cut, and suture ligated with 0 Vicryl.  Anteriorly, the anterior cul-de-sac was undermined with sharp dissection with evidence of bladder junction being recognized.  Retrofilling of the bladder with 300 mL of fluid was done, however, that did not  help exposure of the Bladder reflection.  Continued careful dissection was done until the space between the  bladder and the uterus was recognized and then opened transversely.  The retractor was then readjusted in that area.  At that point, the LigaSure was then utilized to serially bilaterally clamp, cauterize and cut the cardinal ligaments, followed by the uterine vessels bilaterally until the fundus was able to be flipped and exteriorized at which time, the fallopian tubes were identified on the patient's right that was grasped with a Babcock.  The bowels were packed upwardly using the LigaSure apparatus.  The fallopian tube on the patient's right was serially clamped, cauterized, and cut.  The tube was removed.  At that point, opportunity was then taken to use the Ligasure to cauterize the utero-ovarian ligament and round ligament in a sequential form resulting in the uterus been attached from the patient's right side.  On the left, with the tube being exposed the same procedure was performed to remove the left fallopian tube, however, it was started at the proximal portion where the fallopian tube was attached to the uterus.  That fallopian tube was then subsequently removed.  Again, the left utero-ovarian ligament and round ligament were then serially clamped, cauterized, and then cut.  This resulted in the uterus being removed along with the cervix and both tubes.  The left ovary was seen as was the right, they were atrophic but otherwise normal.  The patient underwent additional  surgery by Dr. Patsi Searsannenbaum. Please see the dictated operative note for the details.  Once his portion was finished short of the [place] procedure, the posterior cul-de- sac had McCall culdoplasty being done using 0 Vicryl sutures in both uterosacrals and tied in the midline.  The remaining portion of the surgery including the closure of the vaginal cuff in a vertical fashion was done with Dr. Patsi Searsannenbaum and  his assistant and again directed to this operative report.  SPECIMEN:  Uterus, cervix, fallopian tubes sent to Pathology.  ESTIMATED BLOOD LOSS: less than 30 mL.  URINE OUTPUT:  175 mL.  INTRAOPERATIVE FLUID:  1 L.  COUNTS:  Sponge and instrument counts were correct.  The patient at that point tolerated my procedure well.     Maxie BetterSheronette Surena Welge, M.D.     [place]/MEDQ  D:  04/30/2014  T:  05/01/2014  Job:  [id number]

## 2014-05-01 NOTE — Progress Notes (Signed)
1 Day Post-Op Subjective: POD#1 from transvaginal hysterectomy and sacrospinous fixation of anterior vault and apex, with tissue augmentation implantation with A-Cell  Had bleeding per vagina after vaginal packing fell out postoperatively.  Was replaced and no further bleeding was note (no staining of packing this am).  Subjectively pain has been fairly well controlled with one dose of IV morphine this am.  She has not gotten out of bed and is eating.  Objective: Vital signs in last 24 hours: Temp:  [97.6 F (36.4 C)-98.4 F (36.9 C)] 98 F (36.7 C) (11/20 0355) Pulse Rate:  [58-85] 72 (11/20 0355) Resp:  [0-22] 16 (11/20 0355) BP: (80-110)/(52-71) 92/57 mmHg (11/20 0553) SpO2:  [93 %-100 %] 98 % (11/20 0355) Weight:  [66.679 kg (147 lb)] 66.679 kg (147 lb) (11/19 1530)  Intake/Output from previous day: 11/19 0701 - 11/20 0700 In: 3943.3 [P.O.:120; I.V.:3773.3; IV Piggyback:50] Out: 1625 [Urine:1525; Blood:100] Intake/Output this shift:    Physical Exam:  General: Alert and oriented CV: RRR Lungs: Clear Abdomen: Soft, ND GU: Vaginal packing with no staining in place Ext: NT, No erythema  Lab Results:  Recent Labs  05/01/14 0425  HGB 10.6*  HCT 31.7*   BMET  Recent Labs  05/01/14 0425  NA 138  K 4.5  CL 105  CO2 26  GLUCOSE 105*  BUN 12  CREATININE 0.80  CALCIUM 8.6     Studies/Results: No results found.  Assessment/Plan: 58 yo POD#1 from transvaginal hysterectomy and sacrospinous fixation of anterior vault and apex, with tissue augmentation implantation with A-Cell Overall doing well.  Hypotension likely multifactorial, reassuring that was not tachycardic overnight and making plenty of urine.    DC vaginal packing Trial of void Monitor today Recheck H&H in afternoon Likely home tomorrow   LOS: 1 day   Craige CottaKirby, Will 05/01/2014, 7:03 AM

## 2014-05-01 NOTE — Progress Notes (Signed)
Called Dr. Imelda Pillowannenbaum's office  Spoke with Vernona RiegerLaura. [facility] and retain.

## 2014-05-11 ENCOUNTER — Encounter (HOSPITAL_BASED_OUTPATIENT_CLINIC_OR_DEPARTMENT_OTHER): Payer: Self-pay | Admitting: *Deleted

## 2014-05-11 ENCOUNTER — Other Ambulatory Visit: Payer: Self-pay | Admitting: Urology

## 2014-05-11 NOTE — Progress Notes (Signed)
Pt instructed npo p mn tonight x valium, welbutrin, and mprotonix w sip of water.  [facility].  Needs hgb on arrival.

## 2014-05-12 ENCOUNTER — Encounter (HOSPITAL_BASED_OUTPATIENT_CLINIC_OR_DEPARTMENT_OTHER): Payer: Self-pay | Admitting: Anesthesiology

## 2014-05-12 ENCOUNTER — Ambulatory Visit (HOSPITAL_BASED_OUTPATIENT_CLINIC_OR_DEPARTMENT_OTHER): Payer: 59 | Admitting: Anesthesiology

## 2014-05-12 ENCOUNTER — Encounter (HOSPITAL_BASED_OUTPATIENT_CLINIC_OR_DEPARTMENT_OTHER)
Admission: RE | Disposition: A | Discharge status: Home / Self Care | Payer: Self-pay | Source: Ambulatory Visit | Attending: Urology

## 2014-05-12 ENCOUNTER — Ambulatory Visit (HOSPITAL_BASED_OUTPATIENT_CLINIC_OR_DEPARTMENT_OTHER)
Admission: RE | Admit: 2014-05-12 | Discharge: 2014-05-12 | Disposition: A | Discharge status: Home / Self Care | Payer: 59 | Source: Ambulatory Visit | Attending: Urology | Admitting: Urology

## 2014-05-12 DIAGNOSIS — E039 Hypothyroidism, unspecified: Secondary | ICD-10-CM | POA: Diagnosis not present

## 2014-05-12 DIAGNOSIS — Z882 Allergy status to sulfonamides status: Secondary | ICD-10-CM | POA: Insufficient documentation

## 2014-05-12 DIAGNOSIS — R339 Retention of urine, unspecified: Secondary | ICD-10-CM | POA: Diagnosis not present

## 2014-05-12 DIAGNOSIS — N8111 Cystocele, midline: Secondary | ICD-10-CM | POA: Insufficient documentation

## 2014-05-12 HISTORY — PX: PUBOVAGINAL SLING: SHX1035

## 2014-05-12 HISTORY — DX: Retention of urine, unspecified: R33.9

## 2014-05-12 LAB — POCT HEMOGLOBIN-HEMACUE: Hemoglobin: 12.1 g/dL (ref 12.0–15.0)

## 2014-05-12 SURGERY — CREATION, PUBOVAGINAL SLING
Anesthesia: General | Site: Vagina

## 2014-05-12 MED ORDER — MIDAZOLAM HCL 2 MG/2ML IJ SOLN
INTRAMUSCULAR | Status: AC
  Filled 2014-05-12: qty 2

## 2014-05-12 MED ORDER — PROPOFOL 10 MG/ML IV BOLUS
INTRAVENOUS | Status: DC | PRN
  Administered 2014-05-12: 150 mg via INTRAVENOUS

## 2014-05-12 MED ORDER — BUPIVACAINE-EPINEPHRINE 0.5% -1:200000 IJ SOLN
INTRAMUSCULAR | Status: DC | PRN
  Administered 2014-05-12: 20 mL

## 2014-05-12 MED ORDER — ONDANSETRON HCL 4 MG/2ML IJ SOLN
INTRAMUSCULAR | Status: DC | PRN
  Administered 2014-05-12: 4 mg via INTRAVENOUS

## 2014-05-12 MED ORDER — PROMETHAZINE HCL 25 MG/ML IJ SOLN
6.2500 mg | INTRAMUSCULAR | Status: DC | PRN
  Filled 2014-05-12: qty 1

## 2014-05-12 MED ORDER — LIDOCAINE HCL (CARDIAC) 20 MG/ML IV SOLN
INTRAVENOUS | Status: DC | PRN
  Administered 2014-05-12: 80 mg via INTRAVENOUS
  Administered 2014-05-12: 50 mg via INTRAVENOUS

## 2014-05-12 MED ORDER — MIDAZOLAM HCL 5 MG/5ML IJ SOLN
INTRAMUSCULAR | Status: DC | PRN
  Administered 2014-05-12: 2 mg via INTRAVENOUS

## 2014-05-12 MED ORDER — CEFAZOLIN SODIUM-DEXTROSE 2-3 GM-% IV SOLR
INTRAVENOUS | Status: AC
  Filled 2014-05-12: qty 50

## 2014-05-12 MED ORDER — FENTANYL CITRATE 0.05 MG/ML IJ SOLN
INTRAMUSCULAR | Status: DC | PRN
  Administered 2014-05-12 (×2): 25 ug via INTRAVENOUS

## 2014-05-12 MED ORDER — KETOROLAC TROMETHAMINE 30 MG/ML IJ SOLN
INTRAMUSCULAR | Status: DC | PRN
  Administered 2014-05-12: 30 mg via INTRAVENOUS

## 2014-05-12 MED ORDER — ACETAMINOPHEN 10 MG/ML IV SOLN
INTRAVENOUS | Status: DC | PRN
  Administered 2014-05-12: 1000 mg via INTRAVENOUS

## 2014-05-12 MED ORDER — LACTATED RINGERS IV SOLN
INTRAVENOUS | Status: DC
  Administered 2014-05-12 (×2): via INTRAVENOUS
  Filled 2014-05-12: qty 1000

## 2014-05-12 MED ORDER — BELLADONNA ALKALOIDS-OPIUM 16.2-60 MG RE SUPP
RECTAL | Status: AC
  Filled 2014-05-12: qty 1

## 2014-05-12 MED ORDER — DEXAMETHASONE SODIUM PHOSPHATE 4 MG/ML IJ SOLN
INTRAMUSCULAR | Status: DC | PRN
  Administered 2014-05-12: 10 mg via INTRAVENOUS

## 2014-05-12 MED ORDER — FENTANYL CITRATE 0.05 MG/ML IJ SOLN
25.0000 ug | INTRAMUSCULAR | Status: DC | PRN
  Filled 2014-05-12: qty 1

## 2014-05-12 MED ORDER — KETOROLAC TROMETHAMINE 30 MG/ML IJ SOLN
15.0000 mg | Freq: Once | INTRAMUSCULAR | Status: DC | PRN
  Filled 2014-05-12: qty 1

## 2014-05-12 MED ORDER — SODIUM CHLORIDE 0.9 % IR SOLN
Status: DC | PRN
  Administered 2014-05-12: 500 mL

## 2014-05-12 MED ORDER — FENTANYL CITRATE 0.05 MG/ML IJ SOLN
INTRAMUSCULAR | Status: AC
  Filled 2014-05-12: qty 4

## 2014-05-12 MED ORDER — TRAMADOL-ACETAMINOPHEN 37.5-325 MG PO TABS: 1.0000 | ORAL_TABLET | Freq: Four times a day (QID) | ORAL | Status: DC | PRN

## 2014-05-12 SURGICAL SUPPLY — 63 items
ADH SKN CLS APL DERMABOND .7 (GAUZE/BANDAGES/DRESSINGS)
BAG URINE DRAINAGE (UROLOGICAL SUPPLIES) ×3 IMPLANT
BLADE CLIPPER SURG (BLADE) ×3 IMPLANT
BLADE SURG 10 STRL SS (BLADE) ×3 IMPLANT
BLADE SURG 15 STRL LF DISP TIS (BLADE) ×1 IMPLANT
BLADE SURG 15 STRL SS (BLADE) ×3
BOOTIES KNEE HIGH SLOAN (MISCELLANEOUS) ×3 IMPLANT
CANISTER SUCTION 1200CC (MISCELLANEOUS) IMPLANT
CANISTER SUCTION 2500CC (MISCELLANEOUS) ×6 IMPLANT
CATH FOLEY 2WAY SLVR  5CC 16FR (CATHETERS) ×2
CATH FOLEY 2WAY SLVR 5CC 16FR (CATHETERS) ×1 IMPLANT
COVER MAYO STAND STRL (DRAPES) ×3 IMPLANT
COVER TABLE BACK 60X90 (DRAPES) ×3 IMPLANT
DERMABOND ADVANCED (GAUZE/BANDAGES/DRESSINGS)
DERMABOND ADVANCED .7 DNX12 (GAUZE/BANDAGES/DRESSINGS) IMPLANT
DEVICE CAPIO SLIM BOX (INSTRUMENTS) IMPLANT
DISSECTOR ROUND CHERRY 3/8 STR (MISCELLANEOUS) IMPLANT
DRAPE CAMERA CLOSED 9X96 (DRAPES) ×1 IMPLANT
DRAPE UNDERBUTTOCKS STRL (DRAPE) ×3 IMPLANT
FLOSEAL 10ML (HEMOSTASIS) IMPLANT
GAUZE SPONGE 4X4 16PLY XRAY LF (GAUZE/BANDAGES/DRESSINGS) IMPLANT
GLOVE BIO SURGEON STRL SZ7.5 (GLOVE) ×3 IMPLANT
GLOVE BIOGEL PI IND STRL 7.0 (GLOVE) IMPLANT
GLOVE BIOGEL PI INDICATOR 7.0 (GLOVE) ×4
GLOVE SURG SS PI 7.0 STRL IVOR (GLOVE) ×2 IMPLANT
GOWN PREVENTION PLUS LG XLONG (DISPOSABLE) ×1 IMPLANT
GOWN STRL REIN XL XLG (GOWN DISPOSABLE) ×1 IMPLANT
GOWN STRL REUS W/TWL LRG LVL3 (GOWN DISPOSABLE) ×2 IMPLANT
GOWN STRL REUS W/TWL XL LVL3 (GOWN DISPOSABLE) ×4 IMPLANT
NDL 1/2 CIR CATGUT .05X1.09 (NEEDLE) IMPLANT
NEEDLE 1/2 CIR CATGUT .05X1.09 (NEEDLE) IMPLANT
NEEDLE HYPO 22GX1.5 SAFETY (NEEDLE) ×6 IMPLANT
PACKING VAGINAL (PACKING) ×1 IMPLANT
PENCIL BUTTON HOLSTER BLD 10FT (ELECTRODE) ×3 IMPLANT
PLUG CATH AND CAP STER (CATHETERS) ×3 IMPLANT
RETRACTOR LONRSTAR 16.6X16.6CM (MISCELLANEOUS) ×1 IMPLANT
RETRACTOR STAY HOOK 5MM (MISCELLANEOUS) ×1 IMPLANT
RETRACTOR STER APS 16.6X16.6CM (MISCELLANEOUS)
SET IRRIG Y TYPE TUR BLADDER L (SET/KITS/TRAYS/PACK) ×1 IMPLANT
SHEET LAVH (DRAPES) ×3 IMPLANT
SLEEVE SURGEON STRL (DRAPES) ×2 IMPLANT
SLING SOLYX SYSTEM SIS BX (SLING) IMPLANT
SPONGE LAP 4X18 X RAY DECT (DISPOSABLE) ×1 IMPLANT
SUCTION FRAZIER TIP 10 FR DISP (SUCTIONS) ×3 IMPLANT
SUT ABS MONO DBL WITH NDL 48IN (SUTURE) IMPLANT
SUT ETHILON 2 0 PS N (SUTURE) IMPLANT
SUT MON AB 2-0 SH 27 (SUTURE)
SUT MON AB 2-0 SH27 (SUTURE) IMPLANT
SUT NONABSORB MONO DB W/NDL 48 (SUTURE) IMPLANT
SUT PDS AB 3-0 SH 27 (SUTURE) IMPLANT
SUT VIC AB 0 CT1 36 (SUTURE) IMPLANT
SUT VIC AB 2-0 CT1 27 (SUTURE)
SUT VIC AB 2-0 CT1 TAPERPNT 27 (SUTURE) IMPLANT
SUT VIC AB 2-0 UR6 27 (SUTURE) ×3 IMPLANT
SYR BULB IRRIGATION 50ML (SYRINGE) ×3 IMPLANT
SYR CONTROL 10ML LL (SYRINGE) ×3 IMPLANT
SYRINGE 10CC LL (SYRINGE) ×3 IMPLANT
TRAY DSU PREP LF (CUSTOM PROCEDURE TRAY) ×3 IMPLANT
TUBE CONNECTING 12'X1/4 (SUCTIONS) ×2
TUBE CONNECTING 12X1/4 (SUCTIONS) ×4 IMPLANT
WATER STERILE IRR 3000ML UROMA (IV SOLUTION) IMPLANT
WATER STERILE IRR 500ML POUR (IV SOLUTION) ×4 IMPLANT
YANKAUER SUCT BULB TIP NO VENT (SUCTIONS) IMPLANT

## 2014-05-12 NOTE — Discharge Instructions (Addendum)
Acute Urinary Retention Acute urinary retention is the temporary inability to urinate. This is an uncommon problem in women. It can be caused by:  Infection.  A side effect of a medicine.  A problem in a nearby organ that presses or squeezes on the bladder or the urethra (the tube that drains the bladder).  Psychological problems.   Surgery on your bladder, urethra, or pelvic organs that causes obstruction to the outflow of urine from your bladder. HOME CARE INSTRUCTIONS  If you are sent home with a Foley catheter and a drainage system, you will need to discuss the best course of action with your health care provider. While the catheter is in, maintain a good intake of fluids. Keep the drainage bag emptied and lower than your catheter. This is so that contaminated urine will not flow back into your bladder, which could lead to a urinary tract infection. There are two main types of drainage bags. One is a large bag that usually is used at night. It has a good capacity that will allow you to sleep through the night without having to empty it. The second type is called a leg bag. It has a smaller capacity so it needs to be emptied more frequently. However, the main advantage is that it can be attached by a leg strap and goes underneath your clothing, allowing you the freedom to move about or leave your home. Only take over-the-counter or prescription medicines for pain, discomfort, or fever as directed by your health care provider.  SEEK MEDICAL CARE IF:  You develop a low-grade fever.  You experience spasms or leakage of urine with the spasms. SEEK IMMEDIATE MEDICAL CARE IF:   You develop chills or fever.  Your catheter stops draining urine.  Your catheter falls out.  You start to develop increased bleeding that does not respond to rest and increased fluid intake. MAKE SURE YOU:  Understand these instructions.  Will watch your condition.  Will get help right away if you are not  doing well or get worse. Document Released: 05/28/2006 Document Revised: 03/19/2013 Document Reviewed: 11/07/2012 [facility] Patient Information 2015 [employer] [place]. This information is not intended to replace advice given to you by your heal HOME CARE INSTRUCTIONS FOR VAGINAL SLING  Activity:  -No lifting greater than 10-15 pounds for 1 week, or as instructed by your  physician.             -No sexual intercourse until your f/u visit Diet:  You may return to your normal diet tomorrow.   It is important to keep your       bowels regular during the postoperative period.  To avoid constipation, drink plenty of fluids during the day (8-10 glasses) and eat plenty of fresh fruits and vegetables.  Use a mild laxative or stool softener if necessary.  Wound Care:  You may begin showering tomorrow, or as instructed by your physician.      Return to Work as instructed by your physician.    Special Instructions:   Call your physician if any of these symptoms occur:   -temperature greater than 101 degrees Farenheit.   -redness, swelling or drainage at incision site.   -foul odor of your urine.   -a significant decrease in the amount of urine you have every day.   -severe pain not relieved by your pain medication.    Return to see your doctor .   Call to set up a follow-up appointment.   Patient Signature:  ________________________________________________________  Nurse's Signature:  ________________________________________________________  Post Anesthesia Home Care Instructions  Activity: Get plenty of rest for the remainder of the day. A responsible adult should stay with you for 24 hours following the procedure.  For the next 24 hours, DO NOT: -Drive a car -Advertising copywriterperate machinery -Drink alcoholic beverages -Take any medication unless instructed by your physician -Make any legal decisions or sign important papers.  Meals: Start with liquid foods such as gelatin or soup. Progress to  regular foods as tolerated. Avoid greasy, spicy, heavy foods. If nausea and/or vomiting occur, drink only clear liquids until the nausea and/or vomiting subsides. Call your physician if vomiting continues.  Special Instructions/Symptoms: Your throat may feel dry or sore from the anesthesia or the breathing tube placed in your throat during surgery. If this causes discomfort, gargle with warm salt water. The discomfort should disappear within 24 hours. th care provider. Make sure you discuss any questions you have with your health care provider.  Void prior to discharge.

## 2014-05-12 NOTE — Anesthesia Postprocedure Evaluation (Signed)
  Anesthesia Post-op Note  Patient: Isabel Koch  Procedure(s) Performed: Procedure(s) (LRB): INCISION OF PUBO-VAGINAL SLING (N/A)  Patient Location: PACU  Anesthesia Type: General  Level of Consciousness: awake and alert   Airway and Oxygen Therapy: Patient Spontanous Breathing  Post-op Pain: mild  Post-op Assessment: Post-op Vital signs reviewed, Patient's Cardiovascular Status Stable, Respiratory Function Stable, Patent Airway and No signs of Nausea or vomiting  Last Vitals:  Filed Vitals:   05/12/14 1336  BP: 101/53  Pulse: 79  Temp: 36.1 C  Resp: 13    Post-op Vital Signs: stable   Complications: No apparent anesthesia complications

## 2014-05-12 NOTE — H&P (Signed)
Active Problems Problems  1. Complete Vaginal Cuff Prolapse   Assessed By: Jethro Bolusannenbaum, Chaselyn Nanney (Urology); Last Assessed: 10 Feb 2014 2. Cystocele, midline (N81.11) 3. Urinary retention (R33.9)   Assessed By: Jethro Bolusannenbaum, Rosealee Recinos (Urology); Last Assessed: 11 May 2014  History of Present Illness 58 year old female returns this afternoon for PVR with po urinary retention. Currently taking Bethanechol 25mg  BID & Tamsulosin 0.4mg  BID. She is s/p Ant. vault repair with Acell & Kelly plication, sling by Dr. Patsi Searsannenbaum & hysterectomy by Dr. Cherly Hensenousins on 04/30/14. She had a voiding trial this morning, but PVR this afternoon is 477cc.     She is seeking a 2nd opinion from Dr. Douglass Riversracy Lathrop on 02/24/14. She is para 3-3-0, with cough, laugh, and sneeze incontinence, and "drainage" on her underwear for 6 months to 1 year. Her bladder is now "falling out", outside of the vagina. No history of hysterectomy. Her mother had vaginal prolapse.    She cannot ride a bike, lift weights, or row, as she wants to because of her vaginal prolapse. She is still sexually active, but is difficult with her prolapse. She has not had a pessary. There is no history of tobacco. She does have some constipation, treated with probiotics. She digitate in order to void, to have sex, and to have bowel movements. However, should her fingernail has scraped the vaginal wall, and caused vaginal bleeding recently.   She complains of urinary hesitancy, intermittency, incomplete emptying of her bladder, pelvic pain, suprapubic pain, difficulty initiating her stream with straining, but without frequency urgency, dysuria or nocturia or hematuria.     Video urodynamics was accomplished on 01/28/14, in the sitting position, and shows a maximum cystometric capacity of 366 cc. The first sensation occurs at 107 cc, and normal desire occurs at 159 cc. A strong desire occurs at 305 cc. There is one unstable contraction of 9 cm water at 315 cc,  but she is able to inhibit that contraction.     Valsalva leak point pressure: The cough leak point pressure at 300 cc's is 51cm H20, with mild leakage, with the prolapse reduced. The Valsalva leak point pressure at 300 cc is 63 cm H20, with mild to moderate leakage with the prolapse reduced. There is no stress incontinence prior to reducing the prolapse.   Pressure flow study: The patient has a maximum flow rate of 18 cc/s. Detrusor pressure at maximum flow was 15 7 m water pressure. PVR is 100 cc. VCUG is accomplished, and shows greater than 3 cm bladder neck descent. There is no bony reality noted. There is no reflux noted.This patient has a maximum capacity of 366 cc. With the prolapse reduced, the patient has stress incontinence. She has 1 mild unstable contraction.   Referral letter from Dr. Marcelle OverlieHolland reviewed. Pt would benefit from co-incidental hysterectomy of prolapsed uterus, and co-incidental McColl Culdoplasty. She will benefit from prolapse repair with ACell dermal graft and she will need to have coincidental pubovaginal sling.       Physical Exam Genitourinary:. Swelling from cystocele and vault suspension.  Chaperone Present: kim lewis.  Examination of the external genitalia shows normal female external genitalia. The urethra is not tender. Urethral hypermobility is not present. There is no urethral discharge. Vaginal exam demonstrates the vaginal epithelium to be poorly estrogenized.    Results/Data Urine [Data Includes: Last 1 Day]   30Nov2015  COLOR YELLOW   APPEARANCE CLEAR   SPECIFIC GRAVITY <1.005   pH 6.5  GLUCOSE NEG mg/dL  BILIRUBIN NEG   KETONE NEG mg/dL  BLOOD MOD   PROTEIN NEG mg/dL  UROBILINOGEN 0.2 mg/dL  NITRITE NEG   LEUKOCYTE ESTERASE MOD   SQUAMOUS EPITHELIAL/HPF MODERATE   WBC 0-2 WBC/hpf  RBC NONE SEEN RBC/hpf  BACTERIA NONE SEEN   CRYSTALS NONE SEEN   CASTS NONE SEEN    PVR: Ultrasound PVR 477 ml.    Assessment Assessed  1. Cystocele,  midline (N81.11) 2. Urinary retention (R33.9)  58 yo female with elevated pvr since her sling/cystocele/ anterior vault, hysterectomy. She will have incision of her sling tomorrow.   Plan Health Maintenance  1. UA With REFLEX; [Do Not Release]; Status:Resulted - Requires  Verification;   Done: 30Nov2015 03:10PM Urinary retention  2. PVR U/S; Status:Complete;   Done: 30Nov2015  Decrease to 1/2 dose bethanachol and Flomax.   Schedule incision of sling Tuesday.   Discussion/Summary cc: Dr. Cherly Hensenousins.     Signatures Electronically signed by : Jethro BolusSigmund Kaela Beitz, M.D.; May 11 2014  4:03PM EST

## 2014-05-12 NOTE — Interval H&P Note (Signed)
History and Physical Interval Note:  05/12/2014 12:13 PM  Isabel Koch  has presented today for surgery, with the diagnosis of urinary retention  The various methods of treatment have been discussed with the patient and family. After consideration of risks, benefits and other options for treatment, the patient has consented to  Procedure(s): INCISION OF PUBO-VAGINAL SLING (N/A) as a surgical intervention .  The patient's history has been reviewed, patient examined, no change in status, stable for surgery.  I have reviewed the patient's chart and labs.  Questions were answered to the patient's satisfaction.     Jethro BolusANNENBAUM, Sonal Dorwart I

## 2014-05-12 NOTE — Op Note (Signed)
Pre-operative diagnosis :   Urinary retention  Postoperative diagnosis:  same  Operation:  Incision of pubovaginal sling  Surgeon:  S. Patsi Searsannenbaum, MD  First assistant:  none  Anesthesia:  General LMA  Preparation:  After appropriate premedication, the patient was brought the operating room, placed on the operating table in the dorsal supine position where general LMA anesthesia was introduced. She was then placed in the dorsal lithotomy position with the pubis was prepped with Betadine solution and draped in usual fashion. The arm and was double checked. The history was double checked.  Review history:  Vaginal Cuff Prolapse  Assessed By: Jethro Bolusannenbaum, Mohd Clemons (Urology); Last Assessed: 10 Feb 2014 2. Cystocele, midline (N81.11) 3. Urinary retention (R33.9)  Assessed By: Jethro Bolusannenbaum, Lavaughn Haberle (Urology); Last Assessed: 11 May 2014  History of Present Illness 58 year old female returns this afternoon for PVR with po urinary retention. Currently taking Bethanechol 25mg  BID & Tamsulosin 0.4mg  BID. She is s/p Ant. vault repair with Acell & Kelly plication, sling by Dr. Patsi Searsannenbaum & hysterectomy by Dr. Cherly Hensenousins on 04/30/14. She had a voiding trial this morning, but PVR this afternoon is 477cc.   Statement of  Likelihood of Success: Excellent. TIME-OUT observed.:  Procedure:  Vaginal examination revealed well-healed urethral incision. The patient also had well-healed vaginal incisions from her recent anterior vault suspension with sacrospinous fixation, as well as vaginal hysterectomy with Rogelio SeenMcCall culdoplasty.  Attention was directed to the anterior urethra, where the sutures are identified, incised and removed. The incision was opened, and the mesh was identified. The mesh was found to be more proximal, toward the bladder neck. Was felt that this was responsible for the urinary retention. A right angle was placed behind the mesh, and the mesh was incised in the midline. A copious amount  of metabolic irrigation was then used, and the incision was closed with running 2-0 Vicryl suture. 20 mL of Marcaine 25 with epinephrine 1-20,000 was injected into the wound, to give post operative analgesia. The patient received IV Tylenol and IV Toradol during the procedure. She was awakened, and taken to recovery room in good condition. Foley catheter will be removed in recovery room.

## 2014-05-12 NOTE — Transfer of Care (Signed)
Immediate Anesthesia Transfer of Care Note  Patient: Isabel Koch  Procedure(s) Performed: Procedure(s): INCISION OF PUBO-VAGINAL SLING (N/A)  Patient Location: PACU  Anesthesia Type:General  Level of Consciousness: awake, alert , oriented and patient cooperative  Airway & Oxygen Therapy: Patient Spontanous Breathing and Patient connected to nasal cannula oxygen  Post-op Assessment: Report given to PACU RN and Post -op Vital signs reviewed and stable  Post vital signs: Reviewed and stable  Complications: No apparent anesthesia complications

## 2014-05-12 NOTE — Anesthesia Preprocedure Evaluation (Addendum)
Anesthesia Evaluation  Patient identified by MRN, date of birth, ID band Patient awake    Reviewed: Allergy & Precautions, H&P , NPO status , Patient's Chart, lab work & pertinent test results  Airway Mallampati: II  TM Distance: >3 FB Neck ROM: Full    Dental no notable dental hx. (+) Teeth Intact, Dental Advisory Given   Pulmonary neg pulmonary ROS,  breath sounds clear to auscultation  Pulmonary exam normal       Cardiovascular negative cardio ROS  Rhythm:Regular Rate:Normal     Neuro/Psych negative neurological ROS  negative psych ROS   GI/Hepatic negative GI ROS, Neg liver ROS,   Endo/Other  Hypothyroidism   Renal/GU negative Renal ROS  negative genitourinary   Musculoskeletal negative musculoskeletal ROS (+)   Abdominal   Peds negative pediatric ROS (+)  Hematology negative hematology ROS (+)   Anesthesia Other Findings   Reproductive/Obstetrics negative OB ROS                            Anesthesia Physical Anesthesia Plan  ASA: II  Anesthesia Plan: General   Post-op Pain Management:    Induction: Intravenous  Airway Management Planned: LMA  Additional Equipment:   Intra-op Plan:   Post-operative Plan:   Informed Consent: I have reviewed the patients History and Physical, chart, labs and discussed the procedure including the risks, benefits and alternatives for the proposed anesthesia with the patient or authorized representative who has indicated his/her understanding and acceptance.   Dental advisory given  Plan Discussed with: CRNA and Surgeon  Anesthesia Plan Comments:         Anesthesia Quick Evaluation

## 2014-05-13 ENCOUNTER — Encounter (HOSPITAL_BASED_OUTPATIENT_CLINIC_OR_DEPARTMENT_OTHER): Payer: Self-pay | Admitting: Urology

## 2014-07-23 ENCOUNTER — Other Ambulatory Visit: Payer: 59

## 2015-04-16 IMAGING — US US SOFT TISSUE HEAD/NECK
1 series · 14 of 25 positions shown · non-contrast
Comparison: 08/03/2009 and earlier studies

CLINICAL DATA: Nodule

EXAM:
THYROID ULTRASOUND
TECHNIQUE: Ultrasound examination of the thyroid gland and adjacent soft
tissues was performed.

[Series 1: us soft tissue head/neck · 0.06mm/px · 14 of 46 slices shown]
[im 1/46]
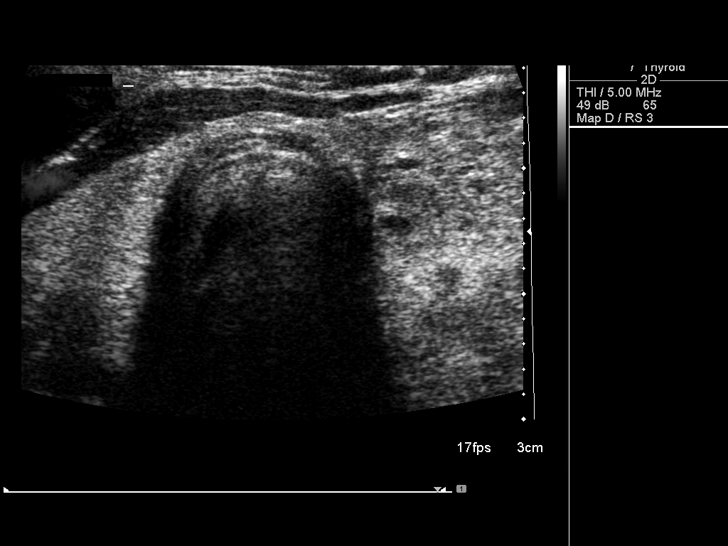
[im 4/46]
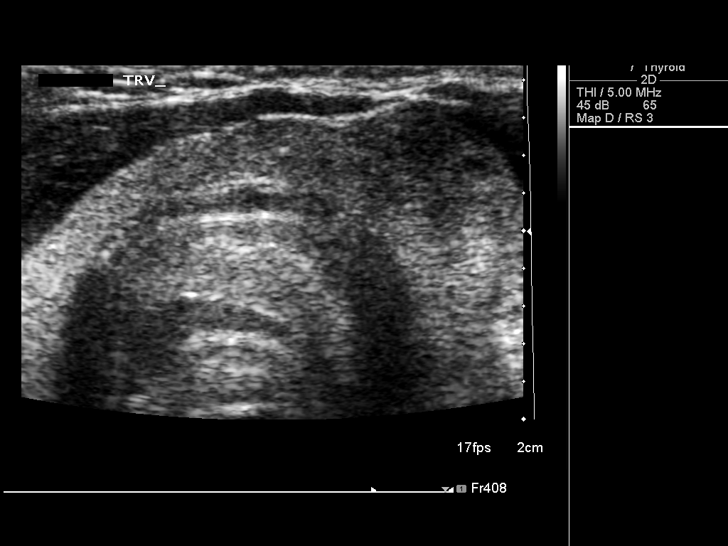
[im 8/46]
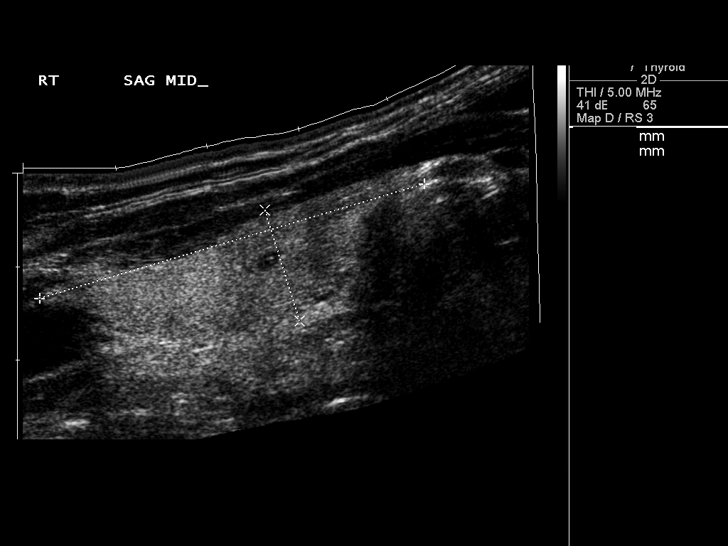
[im 12/46]
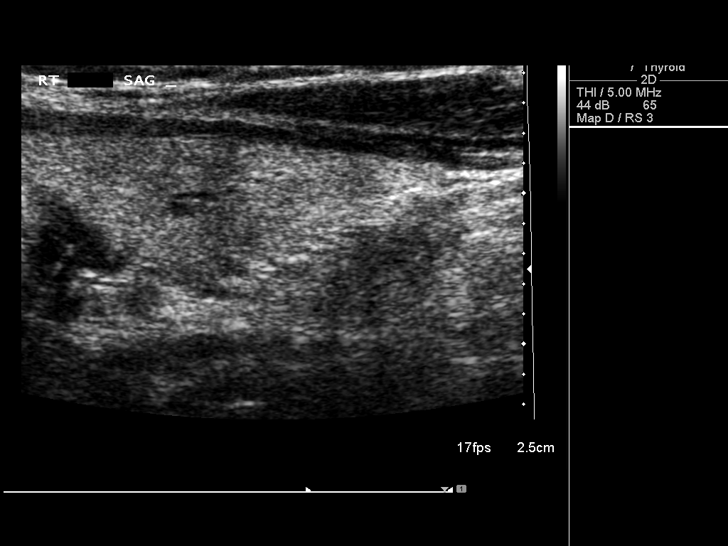
[im 16/46]
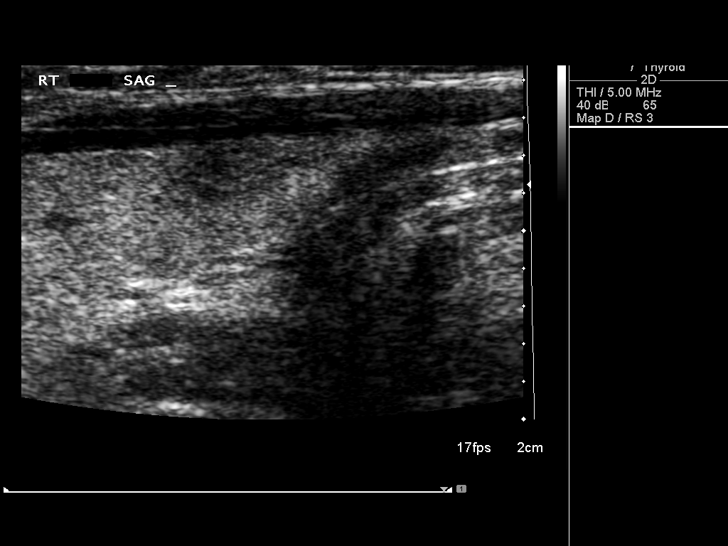
[im 17/46]
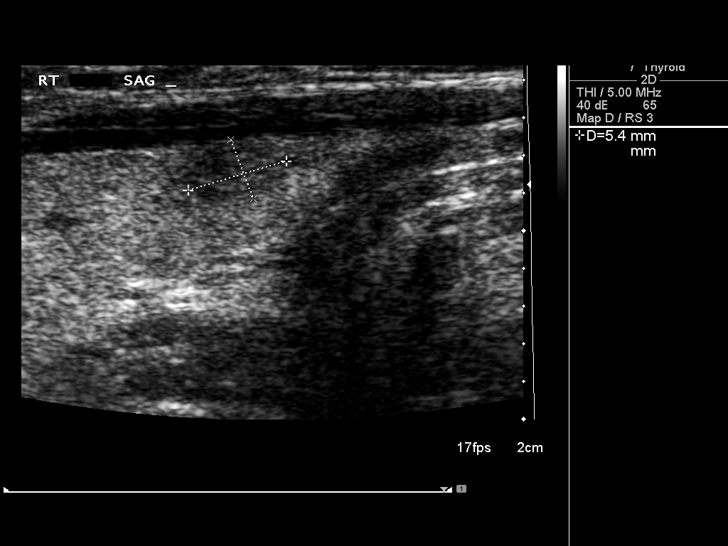
[im 21/46]
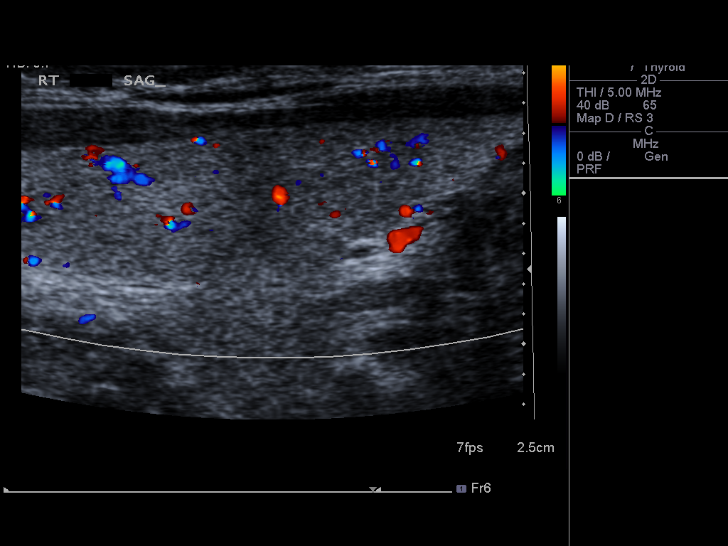
[im 25/46]
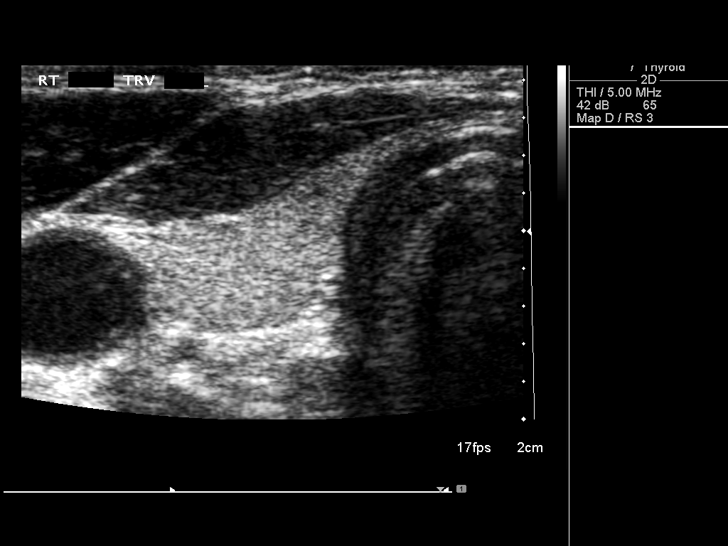
[im 29/46]
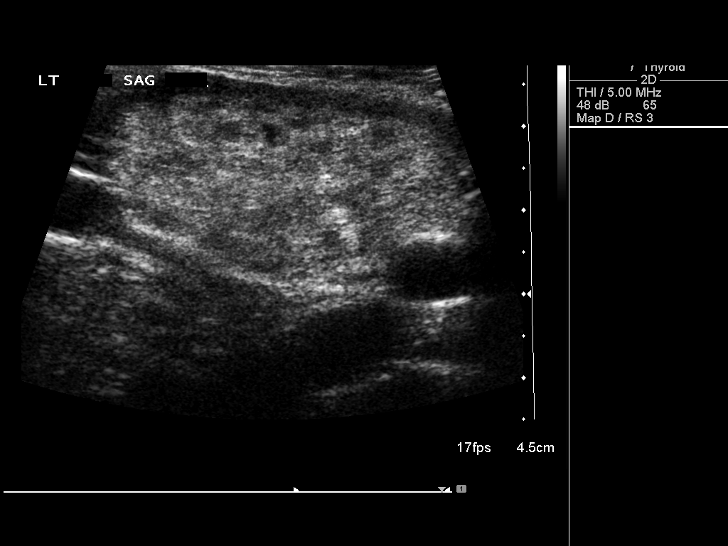
[im 31/46]
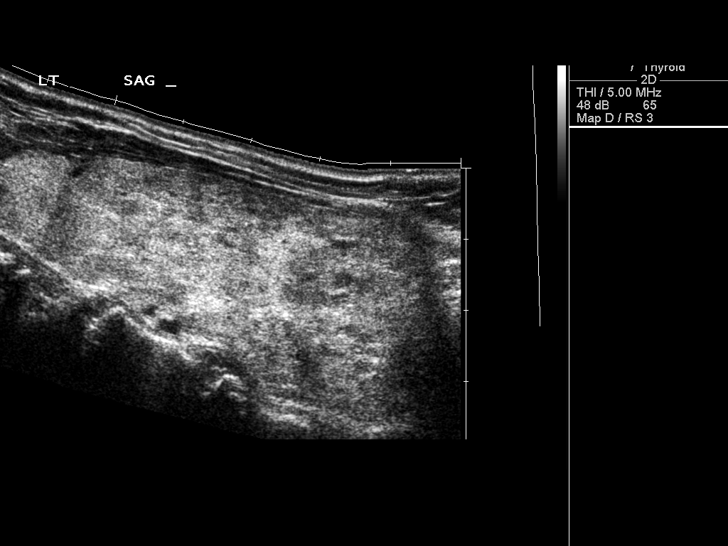
[im 34/46]
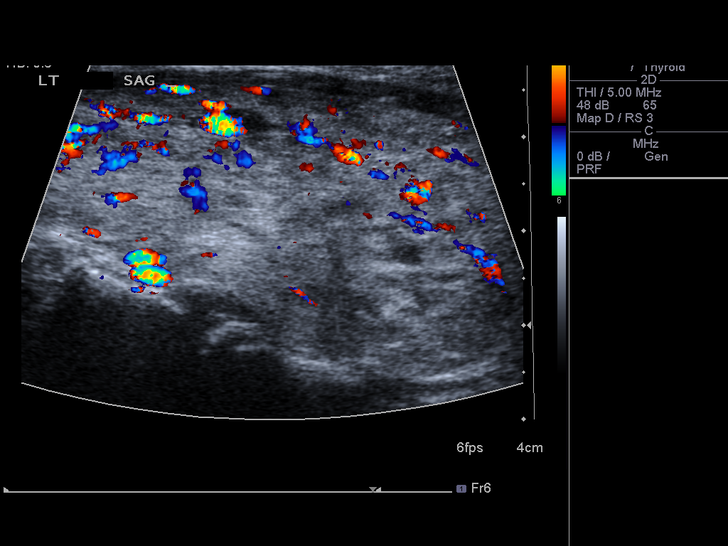
[im 38/46]
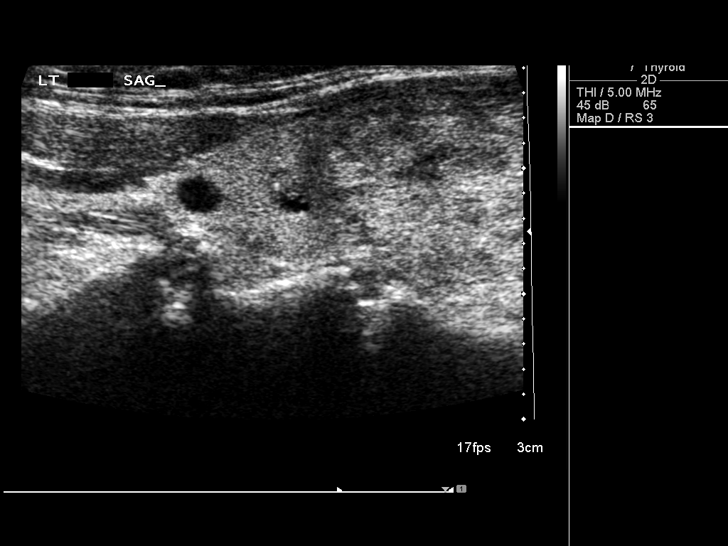
[im 42/46]
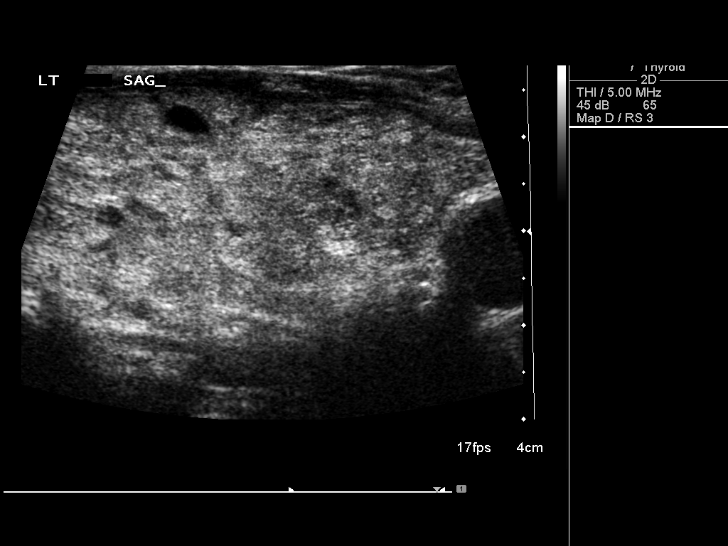
[im 46/46]
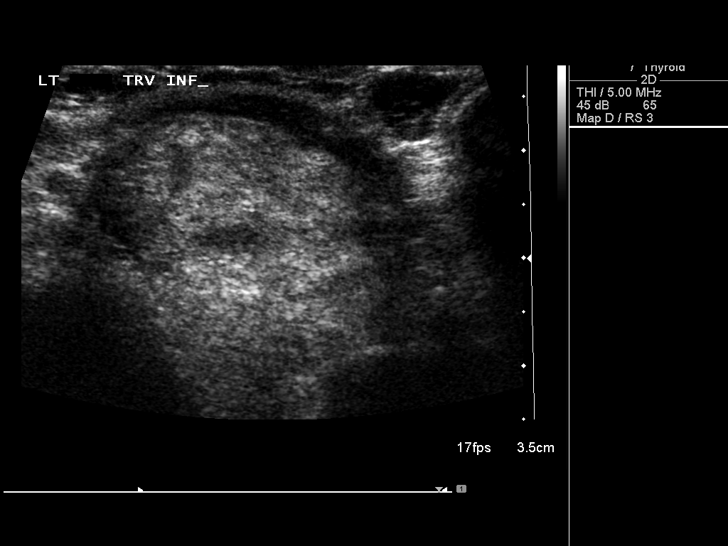

[14 of 25 positions shown; findings below may reference images not displayed]

FINDINGS: Right thyroid lobe

Measurements: 43 x 12 x 14 mm. Mildly inhomogeneous background
echotexture. Several small hypoechoic/cystic nodules measuring 5 mm
or less maximum diameter.

Left thyroid lobe

Measurements: 73 x 31 x 38 mm. There is a dominant 59x 20 x 42 mm
recent study, 54 x 26 x 32 on [DATE]/ 9337). Two small cystic lesions
in the upper pole measuring 3 and 4 mm respectively.

Isthmus

Thickness: 3 mm. There is a 13 x 8 x 10 mm solid nodule on the left
(previously 16 x 7 x 11).

Lymphadenopathy

None visualized.
IMPRESSION: 1. Little interval change in size of dominant left nodule since
9337. By usual size criteria, Findings meet consensus criteria for
biopsy. Ultrasound-guided fine needle aspiration should be
considered, as per the consensus statement: Management of Thyroid
Nodules Detected at US: Society of Radiologists in Ultrasound
2. Additional smaller bilateral and isthmic nodules are stable or
decreased in size.

## 2015-08-04 DIAGNOSIS — H6123 Impacted cerumen, bilateral: Secondary | ICD-10-CM | POA: Insufficient documentation

## 2015-09-06 ENCOUNTER — Encounter: Payer: Self-pay | Admitting: Neurology

## 2015-09-06 ENCOUNTER — Ambulatory Visit (INDEPENDENT_AMBULATORY_CARE_PROVIDER_SITE_OTHER): Payer: 59 | Admitting: Neurology

## 2015-09-06 VITALS — BP 122/78 | HR 78 | Resp 16 | Ht 61.0 in | Wt 125.0 lb

## 2015-09-06 DIAGNOSIS — R413 Other amnesia: Secondary | ICD-10-CM

## 2015-09-06 NOTE — Patient Instructions (Addendum)
You have complaints of memory loss: memory loss or changes in cognitive function can have many reasons and does not always mean you have dementia. Conditions that can contribute to subjective or objective memory loss include: depression, stress, poor sleep from insomnia or sleep apnea, dehydration, fluctuation in blood sugar values, thyroid or electrolyte dysfunction and certain vitamin deficiencies. Dementia can be caused by stroke, brain atherosclerosis or brain vascular disease due to vascular risk factors (smoking, high blood pressure, high cholesterol, obesity and uncontrolled diabetes), certain degenerative brain disorders (including Parkinson's disease and Multiple sclerosis) and by Alzheimer's disease or other, more rare and sometimes hereditary causes. We will do some additional testing: blood work (which we will do today) and we will do a brain scan (MRI). We will not start any new medication as yet. We will also request a formal cognitive test called neuropsychological evaluation which is done by a licensed neuropsychologist. We will make a referral in that regard. We will call you with brain scan test results and monitor your symptoms.   Please drink plenty of water, exercise regularly, eat nutritious food.

## 2015-09-06 NOTE — Progress Notes (Signed)
Subjective:    Patient ID: Isabel Koch is a 60 y.o. female.  HPI     Isabel Foley, MD, PhD [facility] [address] [address] [place], [place] [address]  Dear Dr. Juleen China,   I saw your patient, Isabel Koch, upon your kind request in my neurologic clinic today for initial consultation of her memory loss. The patient is accompanied by her parents today. As you know, Isabel Koch is a 60 year old right-handed woman with an underlying medical history of thyroid problems, hypoglycemia, ADD or ADHD, reflux disease, anxiety, depression, who reports memory problems for the past year. She has had problems focusing, attention difficulty, problems multitasking and has been forgetful as well.  Of note, she had a stressful marriage for over 28 years, has been divorced for 10 years, has had some stress at work. She was told recently by her psychologist that her ADHD may be in part the reason for memory issues. She has not had any recent changes in her medications. She denies any hallucinations or delusions. She has been driving but has had a recent issue with taking the wrong turn on a familiar route. She lives alone. She has 3 grown children. There is no family history of dementia. She does not smoke, drinks alcohol about 3 times a week, does not drink any caffeine on a daily basis except for 1 cup of coffee typically in the morning. She has a high school education and works for [employer]. Her parents do not think she has had any changes in her personality lately but do agree that she has had stress. She has not had any recent blood work in the past 6 months. She has never had a brain scan. I reviewed your office note from 08/23/2015, which you kindly included.  Her Past Medical History Is Significant For: Past Medical History  Diagnosis Date  . Hypothyroidism   . Urinary retention   . ADHD (attention deficit hyperactivity disorder)     Her Past  Surgical History Is Significant For: Past Surgical History  Procedure Laterality Date  . Appendectomy    . Pubovaginal sling N/A 05/12/2014    Procedure: INCISION OF PUBO-VAGINAL SLING;  Surgeon: Kathi Ludwig, MD;  [facility]: [facility];  Service: Urology;  Laterality: N/A;  . Vaginal prolapse repair N/A 04/30/2014    Procedure: ANTERIOR VAULT REPAIR KELLY PLICATION A CELL GRAFT  AUGMETATION ,SACROSPINUS FIXATION AND LYNX SLING ;  Surgeon: Kathi Ludwig, MD;  Location: [facility];  Service: Urology;  Laterality: N/A;  . Vaginal hysterectomy Bilateral 04/30/2014    Procedure: HYSTERECTOMY VAGINAL WITH BILATERAL SALPINGECTOMY;  Surgeon: Serita Kyle, MD;  Location: [facility];  Service: Gynecology;  Laterality: Bilateral;    Her Family History Is Significant For: Family History  Problem Relation Age of Onset  . Thyroid disease Mother   . Thyroid disease Sister     Her Social History Is Significant For: Social History   Social History  . Marital Status: Divorced    Spouse Name: N/A  . Number of Children: 3  . Years of Education: HS    Social History Main Topics  . Smoking status: Never Smoker   . Smokeless tobacco: Never Used  . Alcohol Use: 1.2 oz/week    2 Glasses of wine per week  . Drug Use: No  . Sexual Activity: Not Asked   Other Topics Concern  . None   Social History Narrative   Drinks  about 1 cup of coffee a day, rarely drinks coke    Her Allergies Are:  Allergies  Allergen Reactions  . Sulfa Antibiotics Other (See Comments)    Unknown "needles in mouth" per patient   :   Her Current Medications Are:  Outpatient Encounter Prescriptions as of 09/06/2015  Medication Sig  . ADDERALL XR 30 MG 24 hr capsule   . buPROPion (WELLBUTRIN XL) 300 MG 24 hr tablet Take 300 mg by mouth every morning.   . [DISCONTINUED] diazepam (VALIUM) 5 MG tablet Take 0.5 tablets (2.5 mg total) by mouth 2 (two) times daily.  . [DISCONTINUED] escitalopram  (LEXAPRO) 10 MG tablet Take 10 mg by mouth daily. Patient takes at nite when remembers to take  . [DISCONTINUED] oxyCODONE-acetaminophen (PERCOCET/ROXICET) 5-325 MG per tablet Take 2 tablets by mouth every 4 (four) hours as needed for severe pain.  . [DISCONTINUED] pantoprazole (PROTONIX) 40 MG tablet Take 40 mg by mouth daily.  . [DISCONTINUED] traMADol (ULTRAM) 50 MG tablet Take 50 mg by mouth every 6 (six) hours as needed (pain).   No facility-administered encounter medications on file as of 09/06/2015.  :   Review of Systems:  Out of a complete 14 point review of systems, all are reviewed and negative with the exception of these symptoms as listed below:  Review of Systems  Constitutional: Positive for fatigue.  Neurological: Positive for headaches.       Patient reports that she has had trouble concentrating and having memory issues for past 2-3 years. It seems to have recently become worse.   Psychiatric/Behavioral: Positive for confusion.       Not enough sleep, decreased energy, change in appetite     Objective:  Neurologic Exam  Physical Exam Physical Examination:   Filed Vitals:   09/06/15 1609  BP: 122/78  Pulse: 78  Resp: 16   General Examination: The patient is a very pleasant 60 y.o. female in no acute distress. She appears well-developed and well-nourished and well groomed. She is mildly anxious appearing.   HEENT: Normocephalic, atraumatic, pupils are equal, round and reactive to light and accommodation. Funduscopic exam is normal with sharp disc margins noted. Extraocular tracking is good without limitation to gaze excursion or nystagmus noted. Normal smooth pursuit is noted. Hearing is grossly intact. Face is symmetric with normal facial animation and normal facial sensation. Speech is clear with no dysarthria noted. There is no hypophonia. There is no lip, neck/head, jaw or voice tremor. Neck is supple with full range of passive and active motion. There are no  carotid bruits on auscultation. Oropharynx exam reveals: mild mouth dryness, adequate dental hygiene and mild airway crowding. Mallampati is class II. Tongue protrudes centrally and palate elevates symmetrically.   Chest: Clear to auscultation without wheezing, rhonchi or crackles noted.  Heart: S1+S2+0, regular and normal without murmurs, rubs or gallops noted.   Abdomen: Soft, non-tender and non-distended with normal bowel sounds appreciated on auscultation.  Extremities: There is no pitting edema in the distal lower extremities bilaterally. Pedal pulses are intact.  Skin: Warm and dry without trophic changes noted. There are no varicose veins.  Musculoskeletal: exam reveals no obvious joint deformities, tenderness or joint swelling or erythema.   Neurologically:  Mental status: The patient is awake, alert and oriented in all 4 spheres. Her immediate and remote memory, attention, language skills and fund of knowledge are mildly impaired, but she seems distractable and mildly restless. There is no evidence of aphasia, agnosia, apraxia or anomia.  Speech is clear with normal prosody and enunciation. Thought process needs some redirection. Mood is normal and affect is blunted.    On 09/06/2015: MMSE: 25/30 (missed 2 points on orientation and 3 points for remote recall), CDT: 3/4, AFT: 12/min.   Cranial nerves II - XII are as described above under HEENT exam. In addition: shoulder shrug is normal with equal shoulder height noted. Motor exam: Normal bulk, strength and tone is noted. There is no drift, tremor or rebound. Romberg is negative. Reflexes are 2+ throughout. Babinski: Toes are flexor bilaterally. Fine motor skills and coordination: intact with normal finger taps, normal hand movements, normal rapid alternating patting, normal foot taps and normal foot agility.  Cerebellar testing: No dysmetria or intention tremor on finger to nose testing. Heel to shin is unremarkable bilaterally. There is  no truncal or gait ataxia.  Sensory exam: intact to light touch, pinprick, vibration, temperature sense in the upper and lower extremities.  Gait, station and balance: She stands easily. No veering to one side is noted. No leaning to one side is noted. Posture is age-appropriate and stance is narrow based. Gait shows normal stride length and normal pace. No problems turning are noted. She turns en bloc. Tandem walk is unremarkable.   Assessment and Plan:   In summary, Isabel Koch is a very pleasant 60 y.o.-year old female with an underlying medical history of thyroid problems, hypoglycemia, ADD or ADHD, reflux disease, anxiety, depression, who reports memory problems for the past year. She has had problems focusing, attention difficulty, problems multitasking and has been forgetful as well. Her physical exam neurological exam in nonfocal and the patient and her parents are reassured in that regard. Memory issues consistent from suboptimally treated mood disorder and suboptimally treated ADD or ADHD, exacerbated by stress. All of these are possibilities. I suggested further workup in the form of brain MRI without contrast, with lab work today, and full neuropsychological evaluation with cognitive testing. I made a referral in that regard. Thankfully, she does not have a family history of dementia. We will call her with her blood test results and brain scan results. We may consider an EEG down the road as needed.  I had a long chat with the patient and her parents about my findings and the diagnosis of memory loss and dementia, its prognosis and treatment options. We talked about medical treatments and non-pharmacological approaches. We talked about maintaining a healthy lifestyle in general and staying active mentally and physically. I encouraged the patient to eat healthy, exercise daily and keep well hydrated, to keep a scheduled bedtime and wake time routine, to not skip any meals and eat healthy  snacks in between meals and to have protein with every meal. I stressed the importance of regular exercise, within of course the patient's own mobility limitations. I encouraged the patient to keep up with current events by reading the news paper or watching the news and to do word puzzles, or if feasible, to go on [email or web address].   As far as further diagnostic testing is concerned, I suggested the following: MRI brain without contrast, and formal memory testing in the form of neuropsychological evaluation.  As far as medications are concerned, I recommended the following at this time: no change at this time.  I answered all their questions today and the patient and her parents were in agreement with the above outlined plan. I would like to see the patient back in 3 months, sooner if the need arises and  encouraged them to call with any interim questions, concerns, or problems.   Thank you very much for allowing me to participate in the care of this nice patient. If I can be of any further assistance to you please do not hesitate to call me at [phone].  Sincerely,   Isabel FoleySaima Helayna Dun, MD, PhD

## 2015-09-07 ENCOUNTER — Telehealth: Payer: Self-pay

## 2015-09-07 LAB — COMPREHENSIVE METABOLIC PANEL
ALK PHOS: 66 IU/L (ref 39–117)
ALT: 19 IU/L (ref 0–32)
AST: 22 IU/L (ref 0–40)
Albumin/Globulin Ratio: 1.5 (ref 1.2–2.2)
Albumin: 4.2 g/dL (ref 3.5–5.5)
BILIRUBIN TOTAL: 0.4 mg/dL (ref 0.0–1.2)
BUN / CREAT RATIO: 24 — AB (ref 9–23)
BUN: 18 mg/dL (ref 6–24)
CHLORIDE: 103 mmol/L (ref 96–106)
CO2: 24 mmol/L (ref 18–29)
Calcium: 9.2 mg/dL (ref 8.7–10.2)
Creatinine, Ser: 0.76 mg/dL (ref 0.57–1.00)
GFR calc Af Amer: 99 mL/min/{1.73_m2} (ref >59)
GFR calc non Af Amer: 86 mL/min/{1.73_m2} (ref >59)
GLUCOSE: 100 mg/dL — AB (ref 65–99)
Globulin, Total: 2.8 g/dL (ref 1.5–4.5)
Potassium: 4.7 mmol/L (ref 3.5–5.2)
Sodium: 140 mmol/L (ref 134–144)
Total Protein: 7 g/dL (ref 6.0–8.5)

## 2015-09-07 LAB — RPR: RPR: NONREACTIVE

## 2015-09-07 LAB — B12 AND FOLATE PANEL
Folate: 14.3 ng/mL (ref >3.0)
Vitamin B-12: 806 pg/mL (ref 211–946)

## 2015-09-07 LAB — TSH: TSH: 0.586 u[IU]/mL (ref 0.450–4.500)

## 2015-09-07 LAB — SEDIMENTATION RATE: Sed Rate: 5 mm/hr (ref 0–40)

## 2015-09-07 NOTE — Progress Notes (Signed)
Quick Note:  Labs from yesterday look good, pls notify patient.  Huston FoleySaima Sharifa Bucholz, MD, PhD [facility] ([facility])  ______

## 2015-09-07 NOTE — Telephone Encounter (Signed)
-----   Message from Huston FoleySaima Athar, MD sent at 09/07/2015 11:47 AM EDT ----- Labs from yesterday look good, pls notify patient.  Huston FoleySaima Athar, MD, PhD [facility])

## 2015-09-07 NOTE — Telephone Encounter (Signed)
I spoke to patient and she is aware of results.  

## 2015-09-12 ENCOUNTER — Ambulatory Visit
Admission: RE | Admit: 2015-09-12 | Discharge: 2015-09-12 | Disposition: A | Discharge status: Home / Self Care | Payer: PRIVATE HEALTH INSURANCE | Source: Ambulatory Visit | Attending: Neurology | Admitting: Neurology

## 2015-09-12 DIAGNOSIS — R413 Other amnesia: Secondary | ICD-10-CM

## 2015-09-13 NOTE — Progress Notes (Signed)
Quick Note:  Please call patient regarding the recent brain MRI: The brain scan showed a normal structure of the brain and no significant volume loss which we call atrophy. There were changes in the deeper structures of the brain, which we call white matter changes or microvascular changes. These were reported as mild in Her case. These are tiny white spots, that occur with time and are seen in a variety of conditions, including with normal aging, chronic hypertension, chronic headaches, especially migraine HAs, chronic diabetes, chronic hyperlipidemia. These are not strokes and no mass or lesion were seen which is reassuring. Again, there were no acute findings, such as a stroke, or mass or blood products. No further action is required on this test at this time, other than re-enforcing the importance of good blood pressure control, good cholesterol control, good blood sugar control, and weight management. Please remind patient to keep any upcoming appointments or tests and to call us with any interim questions, concerns, problems or updates. Thanks,  Huston FoleySaima Akacia Boltz, MD, PhD    ______

## 2015-09-15 ENCOUNTER — Telehealth: Payer: Self-pay

## 2015-09-15 NOTE — Telephone Encounter (Signed)
I spoke to patient and she is aware of results and recommendations. Voiced understanding.  

## 2015-09-15 NOTE — Telephone Encounter (Signed)
-----   Message from Huston FoleySaima Athar, MD sent at 09/13/2015  1:07 PM EDT ----- Please call patient regarding the recent brain MRI: The brain scan showed a normal structure of the brain and no significant volume loss which we call atrophy. There were changes in the deeper structures of the brain, which we call white matter changes or microvascular changes. These were reported as mild in Her case. These are tiny white spots, that occur with time and are seen in a variety of conditions, including with normal aging, chronic hypertension, chronic headaches, especially migraine HAs, chronic diabetes, chronic hyperlipidemia. These are not strokes and no mass or lesion were seen which is reassuring. Again, there were no acute findings, such as a stroke, or mass or blood products. No further action is required on this test at this time, other than re-enforcing the importance of good blood pressure control, good cholesterol control, good blood sugar control, and weight management. Please remind patient to keep any upcoming appointments or tests and to call us with any interim questions, concerns, problems or updates. Thanks,  Huston FoleySaima Athar, MD, PhD

## 2015-12-02 ENCOUNTER — Telehealth: Payer: Self-pay | Admitting: Neurology

## 2015-12-02 NOTE — Telephone Encounter (Signed)
Isabel Koch: Patient has a follow-up with me on 12/07/2015 but an appointment for cognitive testing with Dr. Leonides CaveZelson on 12/08/2015. I wonder if we can delay her follow-up appointment with me by a week or so until cognitive test results are also back. This would make her follow-up appointment with me more fruitful. If she agrees, please delay follow-up appointment with me by a week or 2.

## 2015-12-06 NOTE — Telephone Encounter (Signed)
I called patient back and was able to move her appt to July 11th

## 2015-12-06 NOTE — Telephone Encounter (Signed)
I LM for patient. I explained that Dr. Frances FurbishAthar would like to see her 2 weeks after seeing Dr. Leonides CaveZelson (takes about that long to get full report from Dr. Leonides CaveZelson). So I asked for patient to call back and change appt from tomorrow to 2 weeks after appt with Dr. Leonides CaveZelson on 28th.

## 2015-12-07 ENCOUNTER — Ambulatory Visit: Payer: 59 | Admitting: Neurology

## 2015-12-08 ENCOUNTER — Ambulatory Visit: Payer: 59 | Attending: Psychology | Admitting: Psychology

## 2015-12-08 DIAGNOSIS — F09 Unspecified mental disorder due to known physiological condition: Secondary | ICD-10-CM | POA: Insufficient documentation

## 2015-12-08 DIAGNOSIS — R413 Other amnesia: Secondary | ICD-10-CM | POA: Diagnosis not present

## 2015-12-09 ENCOUNTER — Encounter: Payer: Self-pay | Admitting: Psychology

## 2015-12-09 NOTE — Progress Notes (Signed)
[facility]  [address]   Telephone [phone] [address] Fax [phone] [place], [place] [address]  Initial Contact Note  Name:  Isabel Koch Date of Birth; [birth year] MRN:  [id number] Date:  12/09/2015  Isabel Koch is an 60 y.o. female who was referred for neuropsychological evaluation by Huston FoleySaima Athar, MD due to problems with cognitive functioning..   A total of 5 hours was spent today reviewing medical records, interviewing (CPT [phone]) Isabel Koch and her daughter, administering and scoring neurocognitive tests (CPT [phone] & [phone]).  Preliminary Diagnostic Impression: Memory impairment [R41.3 Unspecified neurocognitive disorder [F09]  There were no concerns expressed or behaviors displayed by Isabel Koch that would require immediate attention.   A full report will follow once the planned testing has been completed. Her next appointment is scheduled for 12/15/15.   Gladstone PihMichael F. Susie Pousson, Ph.D Licensed Psychologist 12/09/2015

## 2015-12-15 ENCOUNTER — Encounter: Payer: Self-pay | Admitting: Psychology

## 2015-12-15 ENCOUNTER — Ambulatory Visit: Payer: 59 | Attending: Psychology | Admitting: Psychology

## 2015-12-15 DIAGNOSIS — R413 Other amnesia: Secondary | ICD-10-CM | POA: Diagnosis not present

## 2015-12-15 DIAGNOSIS — F09 Unspecified mental disorder due to known physiological condition: Secondary | ICD-10-CM | POA: Insufficient documentation

## 2015-12-15 NOTE — Progress Notes (Addendum)
[facility]  [address]   Telephone [phone] [address] Fax [phone] [place], [place] [address]   NEUROPSYCHOLOGICAL EVALUATION  *CONFIDENTIAL* This report should not be released without the consent of the client  Name:   Isabel Koch  Date of Birth:  Mar 22, 2056 [facility] MR#:  [id number] Dates of Evaluation: 12/08/15 & 12/15/15  Reason for Referral Isabel Koch is a 60 year-old right-handed woman who was referred for neuropsychological evaluation by Isabel Foley, MD of [facility]. Isabel Koch has an approximate four year history of poor concentration, scattered thinking, memory loss and difficulty organizing that seems to have progressively worsened.  Sources of Information Electronic medical records from the [facility] were reviewed. Records from Isabel Brooklyn, DO of [facility] dated from 06/24/13 to 08/11/15 were obtained with Isabel Koch written consent. Isabel Koch was interviewed. Further information was obtained from her parents, sister and two daughters.   Interview Isabel Koch seemed to be a limited informant. She was either slow to answer autobiographical questions (e.g., the ages of her three children) or could not answer these questions at all (e.g., how many years she has been divorced, her medical history).   She reported having experienced a progressive decline in attention, learning and memory over the past few years, maybe five to six years. She gave examples of having a short attention span, being easily distracted, misplacing personal items, mixing up details of recent events, forgetting appointments or to do planned activities. She stated her belief that her cognitive functioning got "a little better" recently but could not precisely state when this occurred. Other problems she cited included a longtime history of difficulty falling sleep with restless sleep. She did not  report lacking alertness or being prone to fall asleep easily during the daytime, however. She denied problems with gait, eye-hand coordination, balance, limb strength, vision, hearing, swallowing, speech, appetite, smell or taste. She did not report being in any pain. With regards to her mood, she described herself as often feeling anxious and perhaps mildly depressed, usually in reaction to her ongoing worry that she will be fired from her job due to her cognitive difficulties. Otherwise, she did not cite any ongoing life stressors or recent negative life events.  She denied experiencing persistently sad mood, apathy, anhedonia, loss of interests, feelings of worthlessness or hopelessness, suicidal thoughts, mania, hallucinations and delusions.   Her family members were in agreement that Isabel Koch first displayed signs of cognitive difficulties about four to five years ago with subsequent worsening.  Recent examples included repeatedly asking the same question, not remembering how to perform familiar job tasks (as reported by her sister who works for the same company), having difficulty naming objects (e.g., "tool" for "spoon"), not being able to recall the names of some of her grandchildren, having difficulty finding words while speaking (especially within the past few months) and getting lost while driving to familiar locations. Her family members described her as usually appearing in good spirits though having shown heightened emotionality over the past two years as manifested by brief crying or irritability in reaction to arguably minimal provocation. There was no report of any recent major changes in her mood, personality or habits. She has not exhibited confusion, wandering, bizarre behavior, poor judgment, loss of social comportment or unsafe behavior.  Review of Medical Records Isabel Koch consulted Isabel Elisabeth Most, DO of [facility] on 06/24/13 with complaints of lack of focus,  poor concentration, scattered thoughts,  fidgetiness, memory loss and difficulty organizing and prioritizing tasks that had seemed to progressively worsen over the prior one to two years. Other complaints included difficulty falling asleep with restless sleep and daytime fatigue. 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 She was also thought to have co-morbid generalized anxiety and perhaps mild depression, the latter deemed well-managed on bupropion as prescribed by her primary care physician sometime in 2015. She was prescribed Vyvanse with subsequent report of improved concentration and memory. When seen about a year later, Isabel Koch stated her belief that Vyvanse was not working so she was then prescribed Adderall XR. She had reportedly discontinued taking Adderall XR about a week later but then resumed taking it in late 2015 with subsequent report of improved thought organization and memory. It was noted that throughout Koch of 2016 she reported that Adderall XR 30 mg. continued to be effective. By late 2016 Isabel Koch stated her uncertainty as to whether Adderall was still helping. It was noted that she had been under increased stress at work as she reported struggling to learn and recall work-related procedures. She was administered a computerized battery of cognitive tests (CNS Vital Signs) in November 2016 with indications of moderate deficits on multiple measures. Referral to a psychotherapist was suggested. Isabel Koch requested referral to a neurologist.   She was first evaluated by Isabel Koch on 09/06/15. She reported similar complaints as she did to Dr. Elisabeth Koch. She was described as distractible, mildly restless, needing re-direction and displaying blunted  affect. Her Mini-Mental Status Exam score at that visit was 25/30. Isabel Koch concluded that her memory issues might be due to a suboptimally treated mood disorder and/or suboptimally treated ADHD in the setting of stress. A brain MRI was ordered and on 09/12/15 showed scattered nonspecific hyperintensities in the subcortical and deep white matter of both hemispheres that was presumed to represent mild chronic microvascular ischemic changes related to aging.    Background She lives alone. She has been divorced for over fifteen years. She has three adult children, all in their thirties.   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   She reported that she was graduated from high school without history of school-based problems with attention or learning. She reported that she was always "squirmy" in school as a child. According to her parents she was "hyper" but did not have problems paying attention, controlling impulses or behaving in a socially appropriate manner. She reported that she has taken some college classes in subjects of interest though she could not recall details.   Her past medical history was notable for hypothyroidism. As noted above, she has been diagnosed with ADHD, anxiety and mild depression. She reported no history of serious emotional difficulties. She participated in marital counseling but denied history of ever receiving individual mental health treatment.    Her current medications include Adderall XR and bupropion.  She did not report history of unusual childhood illnesses or developmental delays. She reported no history of head injury, loss of consciousness, seizure activity, stroke-like symptoms or exposure to toxic  chemicals.   She denied ever abusing alcohol. She reported that she ingested marijuana within food a few times in her thirties but otherwise denied use of illicit drugs. She has never used  tobacco products.   She was not aware of anyone in her family with a history of memory disorder or dementia.   Observations Observations of this alert and pleasant woman were notable for mild fidgetiness, difficulty finding words while speaking, occasional tangential responses to questions (likely due to inattention) and inconsistent ability to remember details or timeframes of some recent or past life events. Her affect appeared within a wide range and was congruent with her thoughts. Her mood seemed mildly anxious. She spoke in a normal tone of voice and maintained consistent eye contact. No problems were evident for speech articulation, prosody, word selection, message coherence or language comprehension. Her thought processes were coherent and organized without loose associations, verbal perseverations or flight of ideas. Her thought content was devoid of unusual or bizarre ideas.  Evaluation Procedures In addition to a review of medical records as well as clinical interviews, the following tests or questionnaires were administered: Animal Naming Test Beck Anxiety Inventory Beck Depression Inventory-II [place] Visual Form Discrimination Test [facility] Naming Test Brief Neuropsychological Cognitive Examination  [place] Verbal Learning Test-II Controlled Oral Word Association Test Finger Tapping Test Modified [place] Card Soring Test Rey Complex Figure: copy Stroop Color & Word Test Trail Making A & B Wechsler Adult Intelligence Scale-IV: Arithmetic, Block Design, Coding, Digit Span & Similarities Wechsler Memory Scale-IV: Logical Memory, Visual Reproduction & Symbol Span  Wide Range Achievement Test- 4:  Word Reading  Assessment Results Test Validity & Interpretative Considerations Test results  were deemed to represent a valid measure of her cognitive functioning. She did not exhibit signs of physical or emotional discomfort.  She did not report or display problems with vision (she wore her eyeglasses), hearing or motor control. She appeared to consistently maintain alertness, sustain attention and persist to task. She often asked for repetition of test instructions or test questions that exceeded a sentence in length. There were no indications of careless or impulsive responding. She seemed to try her best on testing.   She reported that she took her prescribed dosage of Adderall XR on both days of testing.   Her premorbid intellectual potential was estimated to fall within the Average range based on her educational/vocational background coupled with a measure of word reading skill (Wide Range Achievement Test-4: Word Reading) that represents an over-learned verbal skill that is relatively resistant to the effects of neurological injury or illness.   Her test scores were corrected to reflect norms for her age and, whenever possible, her gender and educational level (i.e., 13 years). A listing of her test scores can be found at the end of this report.   Brief Cognitive Screening The Brief Neuropsychological Cognitive Examination (BNCE) offers a standardized screening of major cognitive functions typically affected by neurological and psychiatric conditions. Her BNCE total score of 24/30 fell within the mild range of impairment. She demonstrated problems performing tasks that assessed orientation (i.e., not oriented to the day of the week, could not state the name of this facility), complex mental tracking (i.e., could not connect numbers in an alternating ascending and descending sequence), working memory (i.e., could recall only 1 of 3 words after brief distracting interval) and set shifting (i.e., could draw only 1 of 3 recurrent geometric designs from memory immediately after having copied  them).  Attention & Executive  Functions Her performances on timed tests of focused visual attention were mixed. On the one hand, her speed to draw lines connecting numbers randomly arrayed on a page in numerical sequence (Trials A) was within the Average range. In contrast, her speed to transcribe symbols to match digits using a key (Wechsler Adult Intelligence Scale-IV (WAIS-IV) Coding) was slower than normal within the Borderline range.  Her auditory attention span, as assessed by her ability to repeat spoken digit sequences (WAIS-IV Digit Span Forward), was within the Average range. In contrast, measures of her ability to hold and actively manipulate information in working memory, such as mentally rearranging digits in reverse or ascending order (WAIS-IV Digit Span Backward & Sequencing), solving metal calculations (WAIS-IV Arithmetic) or immediately recognizing symbols in left to right order (WMS-IV Symbol Span), uniformly fell within the Borderline range.  Her attempt to complete an attentional test that required complex mental tracking and set shifting in order to maintain an ascending and alternating sequence of numbers and letters  (Trails B) was halted after five minutes and several deviations from the sequence,    She performed within the impaired range on a test (Stroop Color & Word Test)that demanded selective attention in order to name  the color a word was printed in while ignoring the more automatic inclination to read the conflicting word (e.g., the word "blue" printed in red ink). She also showed abnormally slowed ability to name color hues.   Measures of verbal generative fluency were mixed. Her ability to generate words to designated letters (Controlled Oral Word Association Test) fell within the Low Average range. In contrast, her categorical fluency (Animal Naming Test) was within the impaired range as she could name only eight animals within a one minute period.   She struggled on  tests of conceptual reasoning and problem-solving. Her ability to form and express abstract verbal ideas, as assessed by her ability to classify ostensibly different objects or ideas to a shared category (WAIS-IV Similarities), fell within the Borderline range. On a test of nonverbal problem-solving that required logical reasoning and conceptual flexibility (Modified [place] [employer] Card Sorting Test), she was able to deduce only one of three sorting principles while making a very high number of errors.   Learning & Memory She demonstrated serious impairment of immediate memory and learning. On the WMS-IV, her Immediate Memory Index, a measure of her ability to immediately recall auditory (i.e., stories read to her) and visual (i.e., figural drawings) information, fell within the impaired range at the below the 1st percentile. For example, she was able to recall only eleven story details out of a possible fifty from two stories. On a word list recall task [facility]([place] Verbal Learning Test-II) her initial immediate recall of five words from a list of sixteen words was within the Low Average range, which suggested adequate auditory attention. Her global immediate word recall, as assessed over five list presentations, declined to the Borderline range. She demonstrated a negligible learning curve.  Her delayed memory was constrained by her limited initial encoding, inconsistent memory storage and signs of confabulatory tendencies. Her WMS-IV Delayed Memory Index, a measure of her ability to recall verbal and visual information after a 20 to 30 minute delay, fell within the impaired range at below the 1st percentile. Compared to her initial level of encoding, she demonstrated abnormal forgetting of stories (i.e., savings score= 11%) though reproduced an expected amount of details from figural drawings. Her scores on delayed recognition tests on which she was presented multiple choices  or asked yes/no questions were within  the impaired range. On the word list recall task [facility]([place] Verbal Learning Test-II) her retention after short and longer (i.e., twenty minute) delay intervals was decent though only relative to her own level of initial learning. That is, of the seven words that she initially encoded, she was able to recall five words after the short delay and three words after the longer delay. Her delayed recall was marred by a high rate of intrusive responses (i.e., words not on the list) and an extremely high rate of false positives on a recognition format, which together suggested confabulatory tendencies.   Language From a qualitative perspective, other than word-finding pauses during discourse, she did not exhibit obvious signs of language problems. Her ability to name drawings of objects to confrontation [facility]([place] Pacific Mutualaming Test) was as expected within the Average range. A noted above, a measure of letter fluency was within the Low Average range while a measure of categorical fluency was within the impaired range. Her word reading skill (Wide Range Achievement Test-4) fell within the Average range.    Visual-Spatial Organization & Visual-Construction  There were no signs of difficulty with spatial attention or visual recognition. Her ability to discriminate between complex visual configurations Therapist, music([place]enton Visual Form Discrimination Test) was normal. Her ability to assemble two-dimensional block designs from models (WAIS-IV Block Design) fell within the Low Average range. Her copy of a spatially-complex design (Rey Complex Figure) was nearly error free.   Fine Motor  She demonstrated consistent right hand preference. No problems with gross motor coordination were observed. Her fine motor speed (Finger Tapping Test) was within the Average range for both hands. She demonstrated the expected degree of superiority for using her dominant hand relative to her non-dominant hand.  Emotional Functioning  She endorsed a minimal  level of affective distress on standardized symptom questionnaires. On the Beck Depression Inventory-II, her score of 12 fell within the minimal range. Her Koch prominent symptom (i.e., 2 on a 0 - 3 scale) was being disappointed in herself. Of note, she did not endorse having had any suicidal thoughts within the past two weeks. On the [facility] Inventory, her score of 6 was likewise within the minimal range.   Summary & Conclusions Ashley MurrainKristine Maziarz is a 60 year-old woman who has displayed an approximate four year history of gradually progressive decline in attention, learning, memory and organizational skills as reported by multiple family members. She was diagnosed with Attention Deficit Hyperactivity Disorder (ADHD) in 2015. Her cognitive functioning has continued to gradually worsen despite use of psychostimulant medication.    Observations of this alert and pleasant woman were notable for fidgetiness, difficulty finding words while speaking, occasional tangential responses to questions (likely due to inattention) and inconsistent ability to remember details or timeframes of recent and past life events.  Neuropsychological evaluation indicated serious compromises of memory and executive functioning. With regards to memory, she demonstrated impaired immediate auditory and visual recall, negligible verbal learning to repetition and confabulatory tendencies on delayed verbal memory tasks. Her deficiencies for executive function were evident on tests that required working memory, mental tracking, set shifting, selective attention, semantic fluency, abstract verbal reasoning or novel problem-solving. Her performances on measures of mental processing speed were variable. On the positive side, measures of auditory attention span, simple visual sequencing, confrontational naming, phonemic fluency, word reading, visual-spatial organization, visual-construction and bimanual fine motor speed were within normal  limits. Of note, she had taken her prescribed dosage of Adderall XR on  both days of testing.  With regards to her psychological functioning, she endorsed minimal levels of affective distress on standardized symptom inventories. She stated that she often worries that will lose her job due to poor performance but otherwise did not cite any ongoing life stressors. Her family members described her as usually appearing in good spirits though having shown heightened emotionality over the past two years as manifested by brief crying or irritability in reaction to arguably minimal provocation. There was no report of any serious or disruptive emotional or behavioral disturbances.   In conclusion, her neuropsychological deficits far exceeded what might be reasonably expected on the basis of ADHD, even considering the possible effects of any co-morbid anxiety and depression. Moreover, the nature of her everyday cognitive errors, the progressive worsening of her cognitive functioning in the absence of any intervening medical or psychiatric changes and her questionable response to psychostimulant medication would also not be typical of adult ADHD. This raises the troubling probability that in addition to or instead of ADHD, she might be experiencing an early-onset neurodegenerative disorder.   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 If deemed medically appropriate, a PET scan or a Functional MRI scan of the brain might assist with differential diagnosis.  2. She was encouraged to more consistently use memory compensatory strategies, such as writing information down, using her cellphone to take notes or to program reminders, asking people to repeat important information and using a GPS while driving.   3. It is recommended that she undergo a repeat neuropsychological evaluation in about one year (or sooner if  clinically indicated) using these test results as a baseline to track for interval changes, if any, in her cognitive functioning.   The results and conclusions from this evaluation were reviewed with Ms. Rada, her parents, her sister and one of her daughters on 12/15/15. All family members agreed that they were not surprised about how poorly she did on neurocognitive testing given their observations of her. They were told that an early-onset neurodegenerative disorder is a possibility though discussion of a definitive diagnosis and treatment plan was deferred to Isabel Koch. The conclusions from this evaluation were also discussed by phone with Dr. Elisabeth Koch, who has been managing her ADHD.     I have appreciated the opportunity to evaluate Ms. Harrie Jeans. Please feel free to contact me with any comments or questions.    ______________________ Gladstone Pih, Ph.D Licensed Psychologist          Copies to:  Isabel Foley, MD [facility]  Isabel Elisabeth Most, DO [facility]        ADDENDUM-NEUROPSYCHOLOGICAL TEST RESULTS  Animal Naming Test Score= 8 <1st (adjusted for age, gender and educational level)   Engineer, site Form Discrimination Test Score= 31/32  Normal    [place] Naming Test Score= 54/60 32nd (adjusted for age, gender and educational level)   Brief Neuropsychological Cognitive Exam Subtest    Range of Impairment  Orientation           Mild  Presidential Memory          None  Naming           None  Comprehension           None  Constructive Praxis          Mild  Shifting Set           Mild  Incomplete Pictures  None  Similarities           None  Attention          Mild  Working Memory           Moderate   [place] Verbal Learning Test-II  Raw score Percentile (adjusted for age)  Trial 1 Free recall   5 32nd    Trial 5 Free recall   7   2nd   Trials 1 - 5 Free recall  32   8th   List B Free Recall   3    7th     Short-Delay Free Recall   4   2nd     Short-Delay Cued Recall    5   1st      Long-Delay Free Recall   3   1st     Long-Delay Cued Recall    5   1st      Total intrusions 12 94th      Total repetitions   1 16th       Recognition Hits 13/16   7th   Recognition False Positives 15 >99th       Controlled Oral Word Association Test Score= 26 words/2 repetitions 19th (adjusted for age, gender and educational level)   Finger Tapping Test R: 47.7  62nd (adjusted for age, gender and educational level)  L:  41.3  58th  (adjusted for age, gender and educational level)   Modified [place] Card Sorting Test  Categories correct        1 <1st  (adjusted for age and education)  Perseverative errors       8   5th              Total errors      28   1st          Executive function composite     61 <1st            Rey Complex Figure: copy  Score= 35/36 normal     Stroop Color Word Test  Score Residual % (adjusted for age and educational level)  Word   88 -12 21st         Color   37 -37 <1st         Color-Word     7 -29 <1st          Trails A Score= 37 0e    32nd (adjusted for age, gender and educational level)  Trails B Score= discontinued after 300s and 3e       <1st (adjusted for age, gender and educational level)   Wechsler Adult Intelligence Scale-IV   Subtest Scaled Score Percentile  Block Design    7   16th     Similarities    6     9th        Digit Span   Forward   Backward   Sequencing    5     9    6    4      5th       37th       9th          2nd         Coding      6     9th         Arithmetic    5     5th    Wechsler Memory Scale-IV Flexible Battery Index Index Score Percentile  Immediate Memory  51   <1st         Auditory Memory  52   <1st        Visual Memory  63     1st         Delayed Memory  60   <1st         Wechsler Memory Scale-IV Symbol Span subtest Scaled score= 4   2nd    Wide Range Achievement Test-4 Subtest  Raw score Standard score  Percentile  Word Reading 62/70   99 47th

## 2015-12-21 ENCOUNTER — Ambulatory Visit (INDEPENDENT_AMBULATORY_CARE_PROVIDER_SITE_OTHER): Payer: 59 | Admitting: Neurology

## 2015-12-21 ENCOUNTER — Encounter: Payer: Self-pay | Admitting: Neurology

## 2015-12-21 VITALS — BP 122/90 | HR 88 | Resp 18 | Ht 61.0 in | Wt 128.0 lb

## 2015-12-21 DIAGNOSIS — F0391 Unspecified dementia with behavioral disturbance: Secondary | ICD-10-CM

## 2015-12-21 DIAGNOSIS — R419 Unspecified symptoms and signs involving cognitive functions and awareness: Secondary | ICD-10-CM

## 2015-12-21 DIAGNOSIS — F909 Attention-deficit hyperactivity disorder, unspecified type: Secondary | ICD-10-CM

## 2015-12-21 DIAGNOSIS — F03918 Unspecified dementia, unspecified severity, with other behavioral disturbance: Secondary | ICD-10-CM

## 2015-12-21 DIAGNOSIS — F99 Mental disorder, not otherwise specified: Secondary | ICD-10-CM | POA: Diagnosis not present

## 2015-12-21 NOTE — Progress Notes (Signed)
Subjective:    Patient ID: Isabel Koch is a 60 y.o. female.  HPI     Interim history:  Isabel Koch is a 60 year old right-handed woman with an underlying medical history of thyroid problems, hypoglycemia, ADD or ADHD, reflux disease, anxiety, depression, who presents for follow-up consultation of her memory loss. The patient is accompanied by her father and her daughter, Charleston Poot, today. I first met her on 09/06/2015 at the request of her primary care physician, at which time she reported memory problems for the past year. She had, in particular problems with her focusing, attention difficulties, problems multitasking and forgetfulness. Her MMSE was 25 out of 30 at the time, clock drawing was 3 out of 4 and animal fluency was 12/m. I suggested further workup in the form of blood work, MRI brain without contrast and neuropsychological evaluation.  Blood work from 09/06/2015 included CMP, TSH, ESR, RPR, B12 and folate, all of these were unremarkable and we called the patient with the test results.  She had a brain MRI without contrast on 09/12/2015: IMPRESSION:  This MRI of the brain without contrast shows the following: 1.   Scattered T2/FLAIR hyperintense foci in the subcortical and deep white matter of both hemispheres. This is a nonspecific finding and most likely represents mild chronic microvascular ischemic changes related to aging. None of the foci appears to be acute.   Demyelination or vasculitis would be less likely to give this pattern. 2.   There were no acute findings.  In addition, personally reviewed the images through the PACS system. We called the patient with the test results.  She had cognitive evaluation with Dr. Valentina Shaggy on 12/08/2015 and a follow-up appointment on 12/15/2015. I reviewed the report:   Summary & Conclusions Isabel Koch is a 60 year-old woman who has displayed an approximate four year history of gradually progressive decline in attention, learning,  memory and organizational skills as reported by multiple family members. She was diagnosed with Attention Deficit Hyperactivity Disorder (ADHD) in 2015. Her cognitive functioning has continued to gradually worsen despite use of psychostimulant medication.      Observations of this alert and pleasant woman were notable for fidgetiness, difficulty finding words while speaking, occasional tangential responses to questions (likely due to inattention) and inconsistent ability to remember details or timeframes of recent and past life events.  Neuropsychological evaluation indicated serious compromises of memory and executive functioning. With regards to memory, she demonstrated impaired immediate auditory and visual recall, negligible verbal learning to repetition and confabulatory tendencies on delayed verbal memory tasks. Her deficiencies for executive function were evident on tests that required working memory, mental tracking, set shifting, selective attention, semantic fluency, abstract verbal reasoning or novel problem-solving. Her performances on measures of mental processing speed were variable. On the positive side, measures of auditory attention span, simple visual sequencing, confrontational naming, phonemic fluency, word reading, visual-spatial organization, visual-construction and bimanual fine motor speed were within normal limits. Of note, she had taken her prescribed dosage of Adderall XR on both days of testing.  With regards to her psychological functioning, she endorsed minimal levels of affective distress on standardized symptom inventories. She stated that she often worries that will lose her job due to poor performance but otherwise did not cite any ongoing life stressors. Her family members described her as usually appearing in good spirits though having shown heightened emotionality over the past two years as manifested by brief crying or irritability in reaction to arguably minimal  provocation. There was  no report of any serious or disruptive emotional or behavioral disturbances.    In conclusion, her neuropsychological deficits far exceeded what might be reasonably expected on the basis of ADHD, even considering the possible effects of any co-morbid anxiety and depression. Moreover, the nature of her everyday cognitive errors, the progressive worsening of her cognitive functioning in the absence of any intervening medical or psychiatric changes and her questionable response to psychostimulant medication would also not be typical of adult ADHD. This raises the troubling probability that in addition to or instead of ADHD, she might be experiencing an early-onset neurodegenerative disorder.   Diagnostic Impressions Unspecified neurocognitive disorder [F09]   Attention Deficit Hyperactivity Disorder, combined type  Recommendations 1. If deemed medically appropriate, a PET scan or a Functional MRI scan of the brain might assist with differential diagnosis.    2. She was encouraged to more consistently use memory compensatory strategies, such as writing information down, using her cellphone to take notes or to program reminders, asking people to repeat important information and using a GPS while driving.    3. It is recommended that she undergo a repeat neuropsychological evaluation in about one year (or sooner if clinically indicated) using these test results as a baseline to track for interval changes, if any, in her cognitive functioning.  The results and conclusions from this evaluation were reviewed with Ms. Isabel Koch, her parents, her sister and one of her daughters on 12/15/15. All family members agreed that they were not surprised about how poorly she did on neurocognitive testing given their observations of her. They were told that an early-onset neurodegenerative disorder is a possibility though discussion of a definitive diagnosis and treatment plan was deferred to Dr.  Rexene Alberts. The conclusions from this evaluation were also discussed by phone with Dr. Johnnye Sima, who has been managing her ADHD.    Today, 12/21/2015: She reports doing about the same. His daughter reports that for the past 5+ years she has noted memory issues with her mom. In the past year or so she has noted changes in her personality, she has become more withdrawn, sometimes more irritable, easily frustrated at times, more angry, no actual violent outbursts. She has been in a focal relationship in the past with a man and was together with him for a long time but not for the past 5+ years. Prior to that she was married. She has 3 children altogether, Charleston Poot is the middle child. There is no family history of dementia, no history of involuntary movements, no Huntington's disease, no significant vascular disease. Patient has a good work environment currently and still drives. She has focus and attention issues and has had that for years, probably since childhood. She has a very supportive family. She denies headaches, numbness, tingling, weakness, speech impairment, appetite is good, tries to drink enough water.  Previously:  09/06/2015: Of note, she had a stressful marriage for over 72 years, has been divorced for 10 years, has had some stress at work. She was told recently by her psychologist that her ADHD may be in part the reason for memory issues. She has not had any recent changes in her medications. She denies any hallucinations or delusions. She has been driving but has had a recent issue with taking the wrong turn on a familiar route. She lives alone. She has 3 grown children. There is no family history of dementia. She does not smoke, drinks alcohol about 3 times a week, does not drink any caffeine on a daily  basis except for 1 cup of coffee typically in the morning. She has a high school education and works for [employer]. Her parents do not think she has had any changes in her personality  lately but do agree that she has had stress. She has not had any recent blood work in the past 6 months. She has never had a brain scan. I reviewed your office note from 08/23/2015, which you kindly included.  Her Past Medical History Is Significant For: Past Medical History  Diagnosis Date  . Hypothyroidism   . Urinary retention   . ADHD (attention deficit hyperactivity disorder)     Her Past Surgical History Is Significant For: Past Surgical History  Procedure Laterality Date  . Appendectomy    . Pubovaginal sling N/A 05/12/2014    Procedure: INCISION OF PUBO-VAGINAL SLING;  Surgeon: Ailene Rud, MD;  Location: [facility];  Service: Urology;  Laterality: N/A;  . Vaginal prolapse repair N/A 04/30/2014    Procedure: ANTERIOR VAULT REPAIR KELLY PLICATION A CELL GRAFT  AUGMETATION ,Bowles AND LYNX SLING ;  Surgeon: Ailene Rud, MD;  Location: [facility];  Service: Urology;  Laterality: N/A;  . Vaginal hysterectomy Bilateral 04/30/2014    Procedure: HYSTERECTOMY VAGINAL WITH BILATERAL SALPINGECTOMY;  Surgeon: Marvene Staff, MD;  Location: [facility];  Service: Gynecology;  Laterality: Bilateral;    Her Family History Is Significant For: Family History  Problem Relation Age of Onset  . Thyroid disease Mother   . Thyroid disease Sister     Her Social History Is Significant For: Social History   Social History  . Marital Status: Divorced    Spouse Name: N/A  . Number of Children: 3  . Years of Education: HS    Social History Main Topics  . Smoking status: Never Smoker   . Smokeless tobacco: Never Used  . Alcohol Use: 1.2 oz/week    2 Glasses of wine per week  . Drug Use: No  . Sexual Activity: Not Asked   Other Topics Concern  . None   Social History Narrative   Drinks about 1 cup of coffee a day, rarely drinks coke    Her Allergies Are:  Allergies  Allergen Reactions  . Sulfa Antibiotics Other (See Comments)     Unknown "needles in mouth" per patient   :   Her Current Medications Are:  Outpatient Encounter Prescriptions as of 12/21/2015  Medication Sig  . ADDERALL XR 30 MG 24 hr capsule   . buPROPion (WELLBUTRIN XL) 300 MG 24 hr tablet Take 300 mg by mouth every morning.    No facility-administered encounter medications on file as of 12/21/2015.  :  Review of Systems:  Out of a complete 14 point review of systems, all are reviewed and negative with the exception of these symptoms as listed below:   Review of Systems  Neurological:       Patient would like to discuss her testing. No new concerns.     Objective:  Neurologic Exam  Physical Exam Physical Examination:   Filed Vitals:   12/21/15 1548  BP: 122/90  Pulse: 88  Resp: 18   General Examination: The patient is a very pleasant 60 y.o. female in no acute distress. She appears well-developed and well-nourished and well groomed. She is mildly anxious appearing, fidgety.   HEENT: Normocephalic, atraumatic, pupils are equal, round and reactive to light and accommodation. Funduscopic exam is normal with sharp disc margins noted. She  has corrective eyeglasses. Extraocular tracking is good without limitation to gaze excursion or nystagmus noted. Normal smooth pursuit is noted. Hearing is grossly intact. Face is symmetric with normal facial animation and normal facial sensation. Speech is clear with no dysarthria noted. There is no hypophonia. There is no lip, neck/head, jaw or voice tremor. Neck is supple with full range of passive and active motion. There are no carotid bruits on auscultation. Oropharynx exam reveals: mild mouth dryness, adequate dental hygiene and mild airway crowding. Mallampati is class II. Tongue protrudes centrally and palate elevates symmetrically.   Chest: Clear to auscultation without wheezing, rhonchi or crackles noted.  Heart: S1+S2+0, regular and normal without murmurs, rubs or gallops noted.   Abdomen: Soft,  non-tender and non-distended with normal bowel sounds appreciated on auscultation.  Extremities: There is no pitting edema in the distal lower extremities bilaterally. Pedal pulses are intact.  Skin: Warm and dry without trophic changes noted. There are no varicose veins.  Musculoskeletal: exam reveals no obvious joint deformities, tenderness or joint swelling or erythema.   Neurologically:  Mental status: The patient is awake, alert and oriented in all 4 spheres. Her immediate and remote memory, attention, language skills and fund of knowledge are mildly impaired, but she seems distractable and mildly restless, fidgety, no clear evidence of chorea, athetosis, myoclonus but overall has trouble sitting still. There is no evidence of aphasia, agnosia, apraxia or anomia. Speech is clear with normal prosody and enunciation. Thought process: needs some redirection. Mood is normal and affect is blunted.    On 09/06/2015: MMSE: 25/30 (missed 2 points on orientation and 3 points for remote recall), CDT: 3/4, AFT: 12/min.   On 12/21/2015: MMSE: 25/30, CDT: 4/4, AFT: 9/min.  Cranial nerves II - XII are as described above under HEENT exam. In addition: shoulder shrug is normal with equal shoulder height noted. Motor exam: Normal bulk, strength and tone is noted. There is no drift, tremor or rebound. Romberg is negative. Reflexes are 2+ throughout. Babinski: Toes are flexor bilaterally. Fine motor skills and coordination: intact with normal finger taps, normal hand movements, normal rapid alternating patting, normal foot taps and normal foot agility.  Cerebellar testing: No dysmetria or intention tremor on finger to nose testing. Heel to shin is unremarkable bilaterally. There is no truncal or gait ataxia.  Sensory exam: intact to light touch in the upper and lower extremities.  Gait, station and balance: She stands easily. No veering to one side is noted. No leaning to one side is noted. Posture is  age-appropriate and stance is narrow based. Gait shows normal stride length and normal pace. No problems turning are noted. Tandem walk is unremarkable.   Assessment and Plan:   In summary, LEIANNE CALLINS is a very pleasant 60 year old female with an underlying medical history of thyroid problems, hypoglycemia, ADD or ADHD, reflux disease, anxiety, and depression, who presents for For follow-up consultation of her memory loss. Her first visit in March 2017 she and her parents reported issues with her memory for the past year, but memory issues have been ongoing for the past 5+ years. On physical and neurological exam she does not have any focality on exam but she is overall motor restlessness fidgety, almost in keeping with akathisia, could be takes but is not stereotypical, no flailing movements, just overall not a complete ability to sit still when she has to, she does not seem to be aware of this and family is not surprised by the way  she presents. They have been used to seeing her like this.  In the past year she may have had some additional changes in her personality and behavior. She has had problems with focusing and attention and learning new tasks, has been forgetful, has had some confusion while driving, has been treated for ADD and has had a history of stress currently and in the past. Workup in the form of blood work initially was unremarkable including B12 level, TSH, RPR, in addition, a brain MRI in April 2017 showed some white matter changes but nothing sinister. More recently, she had neuropsychological evaluation with Dr. Valentina Shaggy and in addition to ADHD, she had findings concerning for a unspecified neurocognitive disorder and we need to get more diagnostic help. I would like to suggest referral to a memory disorders clinic at a academic centers such as [facility], particularly, for consideration of additional imaging tests such as PET scan if feasible. She does not have a family history  of dementia, does not have much in the way of versed factors or presentation of vascular dementia, this could be a more rare form of memory disorder, the differential diagnosis is vast, does include possibly frontotemporal dementia her brain scan was reassuringly negative for any significant atrophy. I think we do need to wait things out more and seek help with a more comprehensive evaluation through memory disorders Center. She does have a very close-knit family in good family support, she also has a good work environment at this time. We will make a referral to [facility] memory disorders clinic, and take it from there, I will see her back routinely in a couple months, sooner as needed. I'm very pleased to meet her daughter today who provided additional valuable information.  I spent 40 minutes in total face-to-face time with the patient, more than 50% of which was spent in counseling and coordination of care, reviewing test results, reviewing medication and discussing or reviewing the diagnosis of dementia with behavioral disturbance, neurocognitive disorder, the prognosis and treatment options.

## 2015-12-21 NOTE — Patient Instructions (Signed)
We will seek consultation with a memory disorders clinic and for consideration of additional testing, such as a brain PET scan and for help with diagnosis and possible treatment options.

## 2016-01-13 ENCOUNTER — Other Ambulatory Visit: Payer: Self-pay | Admitting: Endocrinology

## 2016-01-13 DIAGNOSIS — E041 Nontoxic single thyroid nodule: Secondary | ICD-10-CM

## 2016-01-19 DIAGNOSIS — H9202 Otalgia, left ear: Secondary | ICD-10-CM | POA: Insufficient documentation

## 2016-01-27 ENCOUNTER — Telehealth: Payer: Self-pay | Admitting: Neurology

## 2016-01-27 ENCOUNTER — Encounter: Payer: Self-pay | Admitting: Neurology

## 2016-01-27 ENCOUNTER — Ambulatory Visit (INDEPENDENT_AMBULATORY_CARE_PROVIDER_SITE_OTHER): Payer: 59 | Admitting: Neurology

## 2016-01-27 VITALS — BP 128/88 | HR 88 | Resp 20 | Ht 61.0 in | Wt 122.0 lb

## 2016-01-27 DIAGNOSIS — F909 Attention-deficit hyperactivity disorder, unspecified type: Secondary | ICD-10-CM | POA: Diagnosis not present

## 2016-01-27 DIAGNOSIS — Z562 Threat of job loss: Secondary | ICD-10-CM | POA: Diagnosis not present

## 2016-01-27 DIAGNOSIS — F99 Mental disorder, not otherwise specified: Secondary | ICD-10-CM | POA: Diagnosis not present

## 2016-01-27 DIAGNOSIS — F0391 Unspecified dementia with behavioral disturbance: Secondary | ICD-10-CM

## 2016-01-27 DIAGNOSIS — F03918 Unspecified dementia, unspecified severity, with other behavioral disturbance: Secondary | ICD-10-CM

## 2016-01-27 DIAGNOSIS — F39 Unspecified mood [affective] disorder: Secondary | ICD-10-CM

## 2016-01-27 DIAGNOSIS — R419 Unspecified symptoms and signs involving cognitive functions and awareness: Secondary | ICD-10-CM

## 2016-01-27 NOTE — Progress Notes (Signed)
Subjective:    Patient ID: Isabel Koch is a 60 y.o. female.  HPI     Interim history:   Isabel Koch is a 60 year old right-handed woman with an underlying medical history of thyroid problems, hypoglycemia, ADD or ADHD, reflux disease, anxiety, depression, who presents for follow-up consultation of her memory loss. The patient is accompanied by her mother and her youngest sister, Isabel Koch, today. I last saw her on 12/21/2015, at which time we talked about her neuropsychological test results and brain scan results as well as blood work. She felt about the same, daughter reported a longer standing history of memory issues of 5+ years. She had noted changes in her personality and mood related issues as well. I suggested we refer her to memory disorder Center. I made a referral to [facility], for further diagnostic help in management, consideration of PET scan. She has not yet been seen. Her appointment is pending for October.  Today, 01/27/2016: She reports that she has had difficulty performing her work. Her sister who provides most of the history she has received at least 2 recent emails reprimanding her about her job performance. Sister works in the same workplace in a different role and has been checking in on the patient. Per mother, patient was hyperactive all her life and most likely ADHD. She is the oldest of 4 children, she has 2 younger sisters and a younger brother who is 76 years younger. Mom also reports that patient has been having difficulty driving. She has gotten lost at times and called her mom. Sister reports that patient has been more emotionally labile. She was advised to see psychiatry per primary care physician but it does not sound like she has made an appointment. Sister also reports that patient no longer takes Lexapro and may have never started it. There is no significant family history of dementia, there is no significant family history of mood disorder with the exception  of paternal grandfather who had significant depression and needed ECT. His depression started after he lost his wife due to a brain aneurysm rupture, and she was 9 months pregnant at the time and the baby died as well. According to patient's mother, he remarried and had 2 other children but never got over the death of his first wife.  Previously:   12/21/2015: She reports doing about the same. His daughter reports that for the past 5+ years she has noted memory issues with her mom. In the past year or so she has noted changes in her personality, she has become more withdrawn, sometimes more irritable, easily frustrated at times, more angry, no actual violent outbursts. She has been in a focal relationship in the past with a man and was together with him for a long time but not for the past 5+ years. Prior to that she was married. She has 3 children altogether, Isabel Koch is the middle child. There is no family history of dementia, no history of involuntary movements, no Huntington's disease, no significant vascular disease. Patient has a good work environment currently and still drives. She has focus and attention issues and has had that for years, probably since childhood. She has a very supportive family. She denies headaches, numbness, tingling, weakness, speech impairment, appetite is good, tries to drink enough water.   I first met her on 09/06/2015 at the request of her primary care physician, at which time she reported memory problems for the past year. She had, in particular problems with her focusing, attention  difficulties, problems multitasking and forgetfulness. Her MMSE was 25 out of 30 at the time, clock drawing was 3 out of 4 and animal fluency was 12/m. I suggested further workup in the form of blood work, MRI brain without contrast and neuropsychological evaluation.   Blood work from 09/06/2015 included CMP, TSH, ESR, RPR, B12 and folate, all of these were unremarkable and we called the patient  with the test results.   She had a brain MRI without contrast on 09/12/2015: IMPRESSION:  This MRI of the brain without contrast shows the following: 1.   Scattered T2/FLAIR hyperintense foci in the subcortical and deep white matter of both hemispheres. This is a nonspecific finding and most likely represents mild chronic microvascular ischemic changes related to aging. None of the foci appears to be acute.   Demyelination or vasculitis would be less likely to give this pattern. 2.   There were no acute findings.   In addition, personally reviewed the images through the PACS system. We called the patient with the test results.   She had cognitive evaluation with Dr. Valentina Koch on 12/08/2015 and a follow-up appointment on 12/15/2015. I reviewed the report:    Summary & Conclusions Isabel Koch is a 60 year-old woman who has displayed an approximate four year history of gradually progressive decline in attention, learning, memory and organizational skills as reported by multiple family members. She was diagnosed with Attention Deficit Hyperactivity Disorder (ADHD) in 2015. Her cognitive functioning has continued to gradually worsen despite use of psychostimulant medication.     Observations of this alert and pleasant woman were notable for fidgetiness, difficulty finding words while speaking, occasional tangential responses to questions (likely due to inattention) and inconsistent ability to remember details or timeframes of recent and past life events.   Neuropsychological evaluation indicated serious compromises of memory and executive functioning. With regards to memory, she demonstrated impaired immediate auditory and visual recall, negligible verbal learning to repetition and confabulatory tendencies on delayed verbal memory tasks. Her deficiencies for executive function were evident on tests that required working memory, mental tracking, set shifting, selective attention, semantic fluency,  abstract verbal reasoning or novel problem-solving. Her performances on measures of mental processing speed were variable. On the positive side, measures of auditory attention span, simple visual sequencing, confrontational naming, phonemic fluency, word reading, visual-spatial organization, visual-construction and bimanual fine motor speed were within normal limits. Of note, she had taken her prescribed dosage of Adderall XR on both days of testing.   With regards to her psychological functioning, she endorsed minimal levels of affective distress on standardized symptom inventories. She stated that she often worries that will lose her job due to poor performance but otherwise did not cite any ongoing life stressors. Her family members described her as usually appearing in good spirits though having shown heightened emotionality over the past two years as manifested by brief crying or irritability in reaction to arguably minimal provocation. There was no report of any serious or disruptive emotional or behavioral disturbances.   In conclusion, her neuropsychological deficits far exceeded what might be reasonably expected on the basis of ADHD, even considering the possible effects of any co-morbid anxiety and depression. Moreover, the nature of her everyday cognitive errors, the progressive worsening of her cognitive functioning in the absence of any intervening medical or psychiatric changes and her questionable response to psychostimulant medication would also not be typical of adult ADHD. This raises the troubling probability that in addition to or instead of ADHD, she  might be experiencing an early-onset neurodegenerative disorder.    Diagnostic Impressions Unspecified neurocognitive disorder [F09]   Attention Deficit Hyperactivity Disorder, combined type   Recommendations 1.             If deemed medically appropriate, a PET scan or a Functional MRI scan of the brain might assist with differential  diagnosis.   2.             She was encouraged to more consistently use memory compensatory strategies, such as writing information down, using her cellphone to take notes or to program reminders, asking people to repeat important information and using a GPS while driving.   3.             It is recommended that she undergo a repeat neuropsychological evaluation in about one year (or sooner if clinically indicated) using these test results as a baseline to track for interval changes, if any, in her cognitive functioning.   The results and conclusions from this evaluation were reviewed with Ms. Kutsch, her parents, her sister and one of her daughters on 12/15/15. All family members agreed that they were not surprised about how poorly she did on neurocognitive testing given their observations of her. They were told that an early-onset neurodegenerative disorder is a possibility though discussion of a definitive diagnosis and treatment plan was deferred to Dr. Rexene Alberts. The conclusions from this evaluation were also discussed by phone with Dr. Johnnye Sima, who has been managing her ADHD.     09/06/2015: Of note, she had a stressful marriage for over 14 years, has been divorced for 10 years, has had some stress at work. She was told recently by her psychologist that her ADHD may be in part the reason for memory issues. She has not had any recent changes in her medications. She denies any hallucinations or delusions. She has been driving but has had a recent issue with taking the wrong turn on a familiar route. She lives alone. She has 3 grown children. There is no family history of dementia. She does not smoke, drinks alcohol about 3 times a week, does not drink any caffeine on a daily basis except for 1 cup of coffee typically in the morning. She has a high school education and works for [employer]. Her parents do not think she has had any changes in her personality lately but do agree that she has had  stress. She has not had any recent blood work in the past 6 months. She has never had a brain scan. I reviewed your office note from 08/23/2015, which you kindly included.    Her Past Medical History Is Significant For: Past Medical History:  Diagnosis Date  . ADHD (attention deficit hyperactivity disorder)   . Hypothyroidism   . Urinary retention     Her Past Surgical History Is Significant For: Past Surgical History:  Procedure Laterality Date  . APPENDECTOMY    . PUBOVAGINAL SLING N/A 05/12/2014   Procedure: INCISION OF PUBO-VAGINAL SLING;  Surgeon: Ailene Rud, MD;  Location: [facility];  Service: Urology;  Laterality: N/A;  . VAGINAL HYSTERECTOMY Bilateral 04/30/2014   Procedure: HYSTERECTOMY VAGINAL WITH BILATERAL SALPINGECTOMY;  Surgeon: Marvene Staff, MD;  Location: [facility];  Service: Gynecology;  Laterality: Bilateral;  . VAGINAL PROLAPSE REPAIR N/A 04/30/2014   Procedure: ANTERIOR VAULT REPAIR KELLY PLICATION A CELL GRAFT  AUGMETATION ,Wing AND Janyth Pupa ;  Surgeon: Ailene Rud, MD;  Location: [facility];  Service: Urology;  Laterality: N/A;    Her Family History Is Significant For: Family History  Problem Relation Age of Onset  . Thyroid disease Mother   . Thyroid disease Sister     Her Social History Is Significant For: Social History   Social History  . Marital status: Divorced    Spouse name: N/A  . Number of children: 3  . Years of education: HS    Social History Main Topics  . Smoking status: Never Smoker  . Smokeless tobacco: Never Used  . Alcohol use 1.2 oz/week    2 Glasses of wine per week  . Drug use: No  . Sexual activity: Not Asked   Other Topics Concern  . None   Social History Narrative   Drinks about 1 cup of coffee a day, rarely drinks coke    Her Allergies Are:  Allergies  Allergen Reactions  . Sulfa Antibiotics Other (See Comments)    Unknown "needles in mouth" per patient   :    Her Current Medications Are:  Outpatient Encounter Prescriptions as of 01/27/2016  Medication Sig  . ADDERALL XR 30 MG 24 hr capsule   . buPROPion (WELLBUTRIN XL) 300 MG 24 hr tablet Take 300 mg by mouth every morning.   . Escitalopram Oxalate (LEXAPRO PO) Take by mouth.   No facility-administered encounter medications on file as of 01/27/2016.   :  Review of Systems:  Out of a complete 14 point review of systems, all are reviewed and negative with the exception of these symptoms as listed below:  Review of Systems  Neurological: Positive for dizziness.       Pt reports memory loss is affecting her work greatly. Pt reports occasional dizziness in the morning.    Objective:  Neurologic Exam  Physical Exam Physical Examination:   Vitals:   01/27/16 1600  BP: 128/88  Pulse: 88  Resp: 20   General Examination: The patient is a very pleasant 60 y.o. female in no acute distress. She appears well-developed and well-nourished and well groomed. She is mildly anxious appearing, fidgety, tearful briefly a few times, especially when mom related her paternal grandfathers history.   HEENT: Normocephalic, atraumatic, pupils are equal, round and reactive to light and accommodation. She has corrective eyeglasses. Extraocular tracking is good without limitation to gaze excursion or nystagmus noted. Normal smooth pursuit is noted. Hearing is grossly intact. Face is symmetric with normal facial animation and normal facial sensation. Speech is clear with no dysarthria noted. There is no hypophonia. There is no lip, neck/head, jaw or voice tremor. Neck is supple with full range of passive and active motion. There are no carotid bruits on auscultation. Oropharynx exam reveals: mild mouth dryness, adequate dental hygiene and mild airway crowding. Mallampati is class II. Tongue protrudes centrally and palate elevates symmetrically.   Chest: Clear to auscultation without wheezing, rhonchi or crackles  noted.  Heart: S1+S2+0, regular and normal without murmurs, rubs or gallops noted.   Abdomen: Soft, non-tender and non-distended with normal bowel sounds appreciated on auscultation.  Extremities: There is no pitting edema in the distal lower extremities bilaterally. Pedal pulses are intact.  Skin: Warm and dry without trophic changes noted. There are no varicose veins.  Musculoskeletal: exam reveals no obvious joint deformities, tenderness or joint swelling or erythema.   Neurologically:  Mental status: The patient is awake, alert and oriented in all 4 spheres. Her immediate and remote memory, attention, language skills and fund of knowledge  are impaired, and she seems to rely on her sister primarily for her history, again appears restless and fidgety, no clear evidence of chorea, athetosis, myoclonus but overall has trouble sitting still. There is no evidence of aphasia, agnosia, apraxia or anomia. Speech is clear with normal prosody and enunciation. Thought process: needs some redirection. Mood is mildly depressed and affect is blunted.    On 09/06/2015: MMSE: 25/30 (missed 2 points on orientation and 3 points for remote recall), CDT: 3/4, AFT: 12/min.   On 12/21/2015: MMSE: 25/30, CDT: 4/4, AFT: 9/min.  On 01/27/2016: MMSE: 25/30, CDT: 4/4, AFT: 5/min.  Cranial nerves II - XII are as described above under HEENT exam. In addition: shoulder shrug is normal with equal shoulder height noted. Motor exam: Normal bulk, strength and tone is noted. There is no drift, tremor or rebound. Romberg is negative. Reflexes are 2+ throughout. Babinski: Toes are flexor bilaterally. Fine motor skills and coordination: intact with normal finger taps, normal hand movements, normal rapid alternating patting, normal foot taps and normal foot agility.  Cerebellar testing: No dysmetria or intention tremor on finger to nose testing. Heel to shin is unremarkable bilaterally. There is no truncal or gait ataxia.  Sensory  exam: intact to light touch in the upper and lower extremities.  Gait, station and balance: She stands easily. No veering to one side is noted. No leaning to one side is noted. Posture is age-appropriate and stance is narrow based. Gait shows normal stride length and normal pace.   Assessment and Plan:   In summary, Isabel Koch is a very pleasant 60 year old female with an underlying medical history of thyroid problems, hypoglycemia, ADD or ADHD, reflux disease, anxiety, and depression, who presents for For follow-up consultation of her Progressive neurocognitive dysfunction, and presents for a sooner than scheduled appointment because of difficulty at work and difficulty performing her job duties. Sister works in the same company and a different role and has had to check in on patient and has helped her with some of her tasks that she should be familiar with. She would be with the same company 3 years in October. In July 2017 I referred her to [facility] particularly for help with diagnosis and management, consideration of a PET scan. I will do an EEG which we can do next week. During her first visit she and her parents reported memory issues for 1+ year but her daughter reported during her last visit in July 2017 that memory issues and personality changes have been ongoing for 5+ years. She does not have an actual focality on neurological exam but is noted to be quite restless, fidgety, and mom recalls that as a child she was hyperactive as well. She has had some mood related and personality related changes, has become more withdrawn, more emotionally labile, easily irritable and easily tearful but not necessarily in keeping with pseudobulbar affect. She has had problems with focusing and attention and learning new tasks, has been forgetful, has had some confusion while driving, has been treated for ADD and has had a history of stress currently and in the past, has been in a  stressful relationship as well. Workup in the form of blood work initially was unremarkable including B12 level, TSH, RPR, in addition, a brain MRI in April 2017 showed some white matter changes but nothing sinister, no significant atrophy. More recently, she had neuropsychological evaluation with Dr. Max Fickle June 2017 and in addition to ADHD, she had findings  concerning for a unspecified neurocognitive disorder and we need to get more diagnostic help. I suggested we proceed with an EEG, I also requested that they get FMLA paperwork which I would be happy to fill out. She does not have much in the way of vascular risk factors and no family history of dementia.  Thankfully, she has great psychosocial support. Her primary care referred her to psychiatry encouraged her to keep the appointment or make it if she has not made it yet. 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 We will  try to see if we can expedite her memory evaluation at [facility], she has an appointment pending for October as I understand. I will see her back routinely in 3 months, sooner as needed. I answered all their questions today and the patient and her family were in agreement. Her sister provided additional valuable information.  I spent 40 minutes in total face-to-face time with the patient, more than 50% of which was spent in counseling and coordination of care, reviewing test results, reviewing medication and discussing or reviewing the diagnosis of dementia with behavioral disturbance, neurocognitive disorder, the prognosis and treatment options.

## 2016-01-27 NOTE — Telephone Encounter (Signed)
Isabel Koch, patient's mother who [place]. Advised her that this RN received phone note from patient's sister laurie. Advised that per Laurie's conversation with Dr Leonides CaveZelson, patient has been scheduled to see Dr Frances FurbishAthar today at 4 pm, arrive 3:45 pm. She stated she was unaware of this conversation, but she would be sure patient comes in today. She plans to accompany her, and this RN advised she should. She verbalized understanding, appreciation of call.

## 2016-01-27 NOTE — Telephone Encounter (Addendum)
Sister Binnie RailLaurie Powell called, states patient is having difficulty at work with short term memory, states this is jeopardizing her job, requests for patient to be seen by Dr. Frances FurbishAthar. Jacki ConesLaurie also states, Dr. Leonides CaveZelson had mentioned taking patient out of work but since she was okay he didn't. Jacki ConesLaurie called Dr. Leonides CaveZelson today, Dr. Leonides CaveZelson advised to call Dr. Frances FurbishAthar. Jacki ConesLaurie isn't listed on DPR, advises to call Mother. I skyped nurse Pincus SanesMary C to okay scheduling appointment for today 4pm. Sister advised patient will need to arrive 15 minutes early for 4pm appointment today with Dr. Frances FurbishAthar.

## 2016-01-27 NOTE — Patient Instructions (Addendum)
We will do an EEG (brainwave test), which we will schedule. We will call you with the results. [facility] to see if we can expedite your appointment and move it up from October.  I think it would be best if we wait for [facility] to [facility] you and wait for them to order a PET scan that I talked to you about.  I think you need to make an appointment with psychiatry as suggested by your primary care physician.  I will fill out FMLA paperwork for you in the interim.

## 2016-01-31 ENCOUNTER — Telehealth: Payer: Self-pay | Admitting: Neurology

## 2016-01-31 NOTE — Telephone Encounter (Signed)
Sister Jacki ConesLaurie [phone] called regarding how to reach [facility] and day and time of appointment, states our office was going to try and expedite this appointment.

## 2016-01-31 NOTE — Telephone Encounter (Signed)
I called sister back (on DPR) and gave her the phone number to the clinic.

## 2016-02-01 ENCOUNTER — Ambulatory Visit (INDEPENDENT_AMBULATORY_CARE_PROVIDER_SITE_OTHER): Payer: 59 | Admitting: Neurology

## 2016-02-01 DIAGNOSIS — R410 Disorientation, unspecified: Secondary | ICD-10-CM

## 2016-02-01 DIAGNOSIS — F39 Unspecified mood [affective] disorder: Secondary | ICD-10-CM

## 2016-02-01 DIAGNOSIS — F0391 Unspecified dementia with behavioral disturbance: Secondary | ICD-10-CM

## 2016-02-01 DIAGNOSIS — F03918 Unspecified dementia, unspecified severity, with other behavioral disturbance: Secondary | ICD-10-CM

## 2016-02-01 DIAGNOSIS — F909 Attention-deficit hyperactivity disorder, unspecified type: Secondary | ICD-10-CM

## 2016-02-01 DIAGNOSIS — R419 Unspecified symptoms and signs involving cognitive functions and awareness: Secondary | ICD-10-CM

## 2016-02-01 NOTE — Procedures (Signed)
     History: Ashley MurrainKristine Koch is a 60 year old patient with a history of ADD, anxiety and depression. The patient has a history of some difficulty with memory loss. The patient has become more withdrawn, and irritable. The patient is being evaluated for these changes.  This is a routine EEG. No skull defects are noted. Medications include Adderall, Wellbutrin, and Lexapro.  EEG classification: Dysrhythmia grade 1 generalized  Description of the recording: The background rhythms of this recording consists of a well modulated medium amplitude alpha rhythm of 8 Hz that is reactive to eye opening and closure. As the record progresses, photic stimulation is performed, this results in a minimal but bilateral photic driving response. Hyperventilation is then performed results in some slight buildup of the background rhythm activities without significant slowing seen. Intermittently throughout the recording, brief episodes of one or 2 second bursts of 5-6 Hz theta frequency slowing are seen in a generalized fashion. There do not appear to be any evidence of spike or spike-wave discharges or evidence of focal slowing. EKG monitor shows no evidence of cardiac rhythm abnormalities with a heart rate of 78.  Impression: This is an abnormal EEG recording secondary to episodes of brief generalized slowing in the 5-6 Hz range. This is a nonspecific recording, and can be seen with a mild metabolic or toxic encephalopathy or any dementing type illness. No clear epileptiform discharges are seen.

## 2016-02-03 ENCOUNTER — Telehealth: Payer: Self-pay

## 2016-02-03 NOTE — Telephone Encounter (Signed)
-----   Message from Huston FoleySaima Athar, MD sent at 02/03/2016  8:39 AM EDT ----- Please call patient or family member on DPR: Her EEG was mildly abnormal with mild intermittent slowing noted, this is a nonspecific finding and can be seen in patients with underlying metabolic disorder, or any dementing illness, however, no epileptiform discharges were seen, arguing against intermittent seizures.  Huston FoleySaima Athar, MD, PhD [facility]

## 2016-02-03 NOTE — Telephone Encounter (Signed)
I spoke to patient and other female that was with patient (per patient request). I gave results and advised that we will follow up at next appt. Patient voiced understanding and will call back if needed sooner.

## 2016-02-03 NOTE — Progress Notes (Signed)
Please call patient or family member on DPR: Her EEG was mildly abnormal with mild intermittent slowing noted, this is a nonspecific finding and can be seen in patients with underlying metabolic disorder, or any dementing illness, however, no epileptiform discharges were seen, arguing against intermittent seizures.  Isabel FoleySaima Hiedi Touchton, MD, PhD [facility])

## 2016-03-15 ENCOUNTER — Ambulatory Visit: Payer: 59 | Admitting: Neurology

## 2016-03-29 ENCOUNTER — Telehealth: Payer: Self-pay | Admitting: Neurology

## 2016-03-29 NOTE — Telephone Encounter (Signed)
[place] spoke with sister ([place]), she read an article about Lyme Disease and was curious if this was a possibility for her sister. I advised her that she was not tested for it and Dr. Frances FurbishAthar would have if she felt that patient was at risk. The sister asked if she can be tested here for it. I advised her to [facility] since we have referred patient there and get their opinion if patient should be treated.

## 2016-03-29 NOTE — Telephone Encounter (Signed)
Sister Jacki ConesLaurie called to ask if we tested patient for Lyme disease in the labwork that was done in our office. Please call to advise.

## 2016-05-02 ENCOUNTER — Ambulatory Visit: Payer: 59 | Admitting: Neurology

## 2017-07-13 DIAGNOSIS — F0280 Dementia in other diseases classified elsewhere without behavioral disturbance: Secondary | ICD-10-CM | POA: Insufficient documentation

## 2017-07-13 DIAGNOSIS — F3341 Major depressive disorder, recurrent, in partial remission: Secondary | ICD-10-CM | POA: Insufficient documentation

## 2017-11-12 DIAGNOSIS — F411 Generalized anxiety disorder: Secondary | ICD-10-CM | POA: Insufficient documentation

## 2017-11-12 DIAGNOSIS — J209 Acute bronchitis, unspecified: Secondary | ICD-10-CM | POA: Insufficient documentation

## 2017-12-03 DIAGNOSIS — Z01419 Encounter for gynecological examination (general) (routine) without abnormal findings: Secondary | ICD-10-CM | POA: Insufficient documentation

## 2017-12-03 DIAGNOSIS — Z1159 Encounter for screening for other viral diseases: Secondary | ICD-10-CM | POA: Insufficient documentation

## 2018-01-31 DIAGNOSIS — N61 Mastitis without abscess: Secondary | ICD-10-CM | POA: Insufficient documentation

## 2018-10-31 DIAGNOSIS — L237 Allergic contact dermatitis due to plants, except food: Secondary | ICD-10-CM | POA: Insufficient documentation

## 2019-06-27 DIAGNOSIS — Z86010 Personal history of colonic polyps: Secondary | ICD-10-CM | POA: Diagnosis not present

## 2019-06-27 DIAGNOSIS — K648 Other hemorrhoids: Secondary | ICD-10-CM | POA: Diagnosis not present

## 2019-10-30 DIAGNOSIS — H6123 Impacted cerumen, bilateral: Secondary | ICD-10-CM | POA: Diagnosis not present

## 2019-11-27 DIAGNOSIS — G300 Alzheimer's disease with early onset: Secondary | ICD-10-CM | POA: Diagnosis not present

## 2020-04-07 DIAGNOSIS — G300 Alzheimer's disease with early onset: Secondary | ICD-10-CM | POA: Diagnosis not present

## 2020-04-07 DIAGNOSIS — Z79899 Other long term (current) drug therapy: Secondary | ICD-10-CM | POA: Diagnosis not present

## 2020-04-22 DIAGNOSIS — G300 Alzheimer's disease with early onset: Secondary | ICD-10-CM | POA: Diagnosis not present

## 2020-04-22 DIAGNOSIS — Z1231 Encounter for screening mammogram for malignant neoplasm of breast: Secondary | ICD-10-CM | POA: Diagnosis not present

## 2020-04-22 DIAGNOSIS — R7989 Other specified abnormal findings of blood chemistry: Secondary | ICD-10-CM | POA: Diagnosis not present

## 2020-04-22 DIAGNOSIS — Z13220 Encounter for screening for lipoid disorders: Secondary | ICD-10-CM | POA: Diagnosis not present

## 2020-04-22 DIAGNOSIS — Z0000 Encounter for general adult medical examination without abnormal findings: Secondary | ICD-10-CM | POA: Diagnosis not present

## 2020-04-22 DIAGNOSIS — Z23 Encounter for immunization: Secondary | ICD-10-CM | POA: Diagnosis not present

## 2020-07-05 DIAGNOSIS — G300 Alzheimer's disease with early onset: Secondary | ICD-10-CM | POA: Diagnosis not present

## 2020-07-05 DIAGNOSIS — Z79899 Other long term (current) drug therapy: Secondary | ICD-10-CM | POA: Diagnosis not present

## 2020-08-24 DIAGNOSIS — Z1231 Encounter for screening mammogram for malignant neoplasm of breast: Secondary | ICD-10-CM | POA: Diagnosis not present

## 2020-08-24 DIAGNOSIS — N8111 Cystocele, midline: Secondary | ICD-10-CM | POA: Diagnosis not present

## 2020-08-24 DIAGNOSIS — Z13820 Encounter for screening for osteoporosis: Secondary | ICD-10-CM | POA: Diagnosis not present

## 2020-08-31 ENCOUNTER — Other Ambulatory Visit: Payer: Self-pay | Admitting: Obstetrics and Gynecology

## 2020-08-31 DIAGNOSIS — Z13820 Encounter for screening for osteoporosis: Secondary | ICD-10-CM

## 2020-10-04 DIAGNOSIS — G300 Alzheimer's disease with early onset: Secondary | ICD-10-CM | POA: Diagnosis not present

## 2020-10-26 DIAGNOSIS — H2513 Age-related nuclear cataract, bilateral: Secondary | ICD-10-CM | POA: Diagnosis not present

## 2020-10-26 DIAGNOSIS — H5213 Myopia, bilateral: Secondary | ICD-10-CM | POA: Diagnosis not present

## 2020-11-11 DIAGNOSIS — B372 Candidiasis of skin and nail: Secondary | ICD-10-CM | POA: Diagnosis not present

## 2020-12-07 DIAGNOSIS — N993 Prolapse of vaginal vault after hysterectomy: Secondary | ICD-10-CM | POA: Insufficient documentation

## 2021-01-13 DIAGNOSIS — G300 Alzheimer's disease with early onset: Secondary | ICD-10-CM | POA: Diagnosis not present

## 2021-01-13 DIAGNOSIS — Z79899 Other long term (current) drug therapy: Secondary | ICD-10-CM | POA: Diagnosis not present

## 2021-01-13 DIAGNOSIS — I440 Atrioventricular block, first degree: Secondary | ICD-10-CM | POA: Diagnosis not present

## 2021-01-13 DIAGNOSIS — I4510 Unspecified right bundle-branch block: Secondary | ICD-10-CM | POA: Diagnosis not present

## 2021-01-24 DIAGNOSIS — G300 Alzheimer's disease with early onset: Secondary | ICD-10-CM | POA: Diagnosis not present

## 2021-01-24 DIAGNOSIS — E010 Iodine-deficiency related diffuse (endemic) goiter: Secondary | ICD-10-CM | POA: Diagnosis not present

## 2021-01-25 DIAGNOSIS — G300 Alzheimer's disease with early onset: Secondary | ICD-10-CM | POA: Diagnosis not present

## 2021-01-25 DIAGNOSIS — E010 Iodine-deficiency related diffuse (endemic) goiter: Secondary | ICD-10-CM | POA: Diagnosis not present

## 2021-02-03 DIAGNOSIS — R82998 Other abnormal findings in urine: Secondary | ICD-10-CM | POA: Diagnosis not present

## 2021-04-04 DIAGNOSIS — Z9889 Other specified postprocedural states: Secondary | ICD-10-CM | POA: Diagnosis not present

## 2021-04-04 DIAGNOSIS — R053 Chronic cough: Secondary | ICD-10-CM | POA: Diagnosis not present

## 2021-04-04 DIAGNOSIS — B372 Candidiasis of skin and nail: Secondary | ICD-10-CM | POA: Diagnosis not present

## 2021-04-04 DIAGNOSIS — L304 Erythema intertrigo: Secondary | ICD-10-CM | POA: Diagnosis not present

## 2021-04-04 DIAGNOSIS — R052 Subacute cough: Secondary | ICD-10-CM | POA: Diagnosis not present

## 2021-05-11 DIAGNOSIS — Z23 Encounter for immunization: Secondary | ICD-10-CM | POA: Diagnosis not present

## 2021-05-12 DIAGNOSIS — G300 Alzheimer's disease with early onset: Secondary | ICD-10-CM | POA: Diagnosis not present

## 2021-05-12 DIAGNOSIS — R450 Nervousness: Secondary | ICD-10-CM | POA: Diagnosis not present

## 2021-05-12 DIAGNOSIS — F0284 Dementia in other diseases classified elsewhere, unspecified severity, with anxiety: Secondary | ICD-10-CM | POA: Diagnosis not present

## 2021-05-12 DIAGNOSIS — Z79899 Other long term (current) drug therapy: Secondary | ICD-10-CM | POA: Diagnosis not present

## 2021-06-10 DIAGNOSIS — G300 Alzheimer's disease with early onset: Secondary | ICD-10-CM | POA: Diagnosis not present

## 2021-06-10 DIAGNOSIS — M6281 Muscle weakness (generalized): Secondary | ICD-10-CM | POA: Diagnosis not present

## 2021-06-13 DIAGNOSIS — K30 Functional dyspepsia: Secondary | ICD-10-CM | POA: Diagnosis not present

## 2021-06-13 DIAGNOSIS — G309 Alzheimer's disease, unspecified: Secondary | ICD-10-CM | POA: Diagnosis not present

## 2021-06-13 DIAGNOSIS — G300 Alzheimer's disease with early onset: Secondary | ICD-10-CM | POA: Diagnosis not present

## 2021-06-13 DIAGNOSIS — K219 Gastro-esophageal reflux disease without esophagitis: Secondary | ICD-10-CM | POA: Diagnosis not present

## 2021-06-13 DIAGNOSIS — M6281 Muscle weakness (generalized): Secondary | ICD-10-CM | POA: Diagnosis not present

## 2021-06-14 DIAGNOSIS — G309 Alzheimer's disease, unspecified: Secondary | ICD-10-CM | POA: Diagnosis not present

## 2021-06-14 DIAGNOSIS — M6281 Muscle weakness (generalized): Secondary | ICD-10-CM | POA: Diagnosis not present

## 2021-06-14 DIAGNOSIS — R21 Rash and other nonspecific skin eruption: Secondary | ICD-10-CM | POA: Diagnosis not present

## 2021-06-14 DIAGNOSIS — F32A Depression, unspecified: Secondary | ICD-10-CM | POA: Diagnosis not present

## 2021-06-14 DIAGNOSIS — G300 Alzheimer's disease with early onset: Secondary | ICD-10-CM | POA: Diagnosis not present

## 2021-06-14 DIAGNOSIS — Z79899 Other long term (current) drug therapy: Secondary | ICD-10-CM | POA: Diagnosis not present

## 2021-06-15 DIAGNOSIS — K219 Gastro-esophageal reflux disease without esophagitis: Secondary | ICD-10-CM | POA: Diagnosis not present

## 2021-06-15 DIAGNOSIS — F32A Depression, unspecified: Secondary | ICD-10-CM | POA: Diagnosis not present

## 2021-06-15 DIAGNOSIS — G300 Alzheimer's disease with early onset: Secondary | ICD-10-CM | POA: Diagnosis not present

## 2021-06-15 DIAGNOSIS — M6281 Muscle weakness (generalized): Secondary | ICD-10-CM | POA: Diagnosis not present

## 2021-06-15 DIAGNOSIS — G309 Alzheimer's disease, unspecified: Secondary | ICD-10-CM | POA: Diagnosis not present

## 2021-06-16 DIAGNOSIS — G300 Alzheimer's disease with early onset: Secondary | ICD-10-CM | POA: Diagnosis not present

## 2021-06-16 DIAGNOSIS — B3789 Other sites of candidiasis: Secondary | ICD-10-CM | POA: Diagnosis not present

## 2021-06-16 DIAGNOSIS — E559 Vitamin D deficiency, unspecified: Secondary | ICD-10-CM | POA: Diagnosis not present

## 2021-06-16 DIAGNOSIS — E785 Hyperlipidemia, unspecified: Secondary | ICD-10-CM | POA: Diagnosis not present

## 2021-06-16 DIAGNOSIS — F32A Depression, unspecified: Secondary | ICD-10-CM | POA: Diagnosis not present

## 2021-06-16 DIAGNOSIS — M6281 Muscle weakness (generalized): Secondary | ICD-10-CM | POA: Diagnosis not present

## 2021-06-16 DIAGNOSIS — N189 Chronic kidney disease, unspecified: Secondary | ICD-10-CM | POA: Diagnosis not present

## 2021-06-16 DIAGNOSIS — E7800 Pure hypercholesterolemia, unspecified: Secondary | ICD-10-CM | POA: Diagnosis not present

## 2021-06-16 DIAGNOSIS — G309 Alzheimer's disease, unspecified: Secondary | ICD-10-CM | POA: Diagnosis not present

## 2021-06-16 DIAGNOSIS — D518 Other vitamin B12 deficiency anemias: Secondary | ICD-10-CM | POA: Diagnosis not present

## 2021-06-16 DIAGNOSIS — E039 Hypothyroidism, unspecified: Secondary | ICD-10-CM | POA: Diagnosis not present

## 2021-06-16 DIAGNOSIS — D649 Anemia, unspecified: Secondary | ICD-10-CM | POA: Diagnosis not present

## 2021-06-16 DIAGNOSIS — E119 Type 2 diabetes mellitus without complications: Secondary | ICD-10-CM | POA: Diagnosis not present

## 2021-06-17 DIAGNOSIS — G300 Alzheimer's disease with early onset: Secondary | ICD-10-CM | POA: Diagnosis not present

## 2021-06-17 DIAGNOSIS — M6281 Muscle weakness (generalized): Secondary | ICD-10-CM | POA: Diagnosis not present

## 2021-06-20 DIAGNOSIS — M6281 Muscle weakness (generalized): Secondary | ICD-10-CM | POA: Diagnosis not present

## 2021-06-20 DIAGNOSIS — G300 Alzheimer's disease with early onset: Secondary | ICD-10-CM | POA: Diagnosis not present

## 2021-06-21 DIAGNOSIS — M6281 Muscle weakness (generalized): Secondary | ICD-10-CM | POA: Diagnosis not present

## 2021-06-21 DIAGNOSIS — G300 Alzheimer's disease with early onset: Secondary | ICD-10-CM | POA: Diagnosis not present

## 2021-06-22 DIAGNOSIS — M6281 Muscle weakness (generalized): Secondary | ICD-10-CM | POA: Diagnosis not present

## 2021-06-22 DIAGNOSIS — G300 Alzheimer's disease with early onset: Secondary | ICD-10-CM | POA: Diagnosis not present

## 2021-06-23 DIAGNOSIS — E785 Hyperlipidemia, unspecified: Secondary | ICD-10-CM | POA: Diagnosis not present

## 2021-06-23 DIAGNOSIS — M6281 Muscle weakness (generalized): Secondary | ICD-10-CM | POA: Diagnosis not present

## 2021-06-23 DIAGNOSIS — G300 Alzheimer's disease with early onset: Secondary | ICD-10-CM | POA: Diagnosis not present

## 2021-06-23 DIAGNOSIS — R52 Pain, unspecified: Secondary | ICD-10-CM | POA: Diagnosis not present

## 2021-06-24 DIAGNOSIS — M6281 Muscle weakness (generalized): Secondary | ICD-10-CM | POA: Diagnosis not present

## 2021-06-24 DIAGNOSIS — G300 Alzheimer's disease with early onset: Secondary | ICD-10-CM | POA: Diagnosis not present

## 2021-06-27 DIAGNOSIS — Z79899 Other long term (current) drug therapy: Secondary | ICD-10-CM | POA: Diagnosis not present

## 2021-06-28 DIAGNOSIS — G300 Alzheimer's disease with early onset: Secondary | ICD-10-CM | POA: Diagnosis not present

## 2021-06-28 DIAGNOSIS — M6281 Muscle weakness (generalized): Secondary | ICD-10-CM | POA: Diagnosis not present

## 2021-06-29 DIAGNOSIS — G300 Alzheimer's disease with early onset: Secondary | ICD-10-CM | POA: Diagnosis not present

## 2021-06-29 DIAGNOSIS — M6281 Muscle weakness (generalized): Secondary | ICD-10-CM | POA: Diagnosis not present

## 2021-06-30 DIAGNOSIS — M6281 Muscle weakness (generalized): Secondary | ICD-10-CM | POA: Diagnosis not present

## 2021-06-30 DIAGNOSIS — G300 Alzheimer's disease with early onset: Secondary | ICD-10-CM | POA: Diagnosis not present

## 2021-07-01 DIAGNOSIS — M6281 Muscle weakness (generalized): Secondary | ICD-10-CM | POA: Diagnosis not present

## 2021-07-01 DIAGNOSIS — G300 Alzheimer's disease with early onset: Secondary | ICD-10-CM | POA: Diagnosis not present

## 2021-07-02 DIAGNOSIS — M6281 Muscle weakness (generalized): Secondary | ICD-10-CM | POA: Diagnosis not present

## 2021-07-02 DIAGNOSIS — G300 Alzheimer's disease with early onset: Secondary | ICD-10-CM | POA: Diagnosis not present

## 2021-07-04 DIAGNOSIS — M6281 Muscle weakness (generalized): Secondary | ICD-10-CM | POA: Diagnosis not present

## 2021-07-04 DIAGNOSIS — G300 Alzheimer's disease with early onset: Secondary | ICD-10-CM | POA: Diagnosis not present

## 2021-07-05 DIAGNOSIS — G300 Alzheimer's disease with early onset: Secondary | ICD-10-CM | POA: Diagnosis not present

## 2021-07-05 DIAGNOSIS — M6281 Muscle weakness (generalized): Secondary | ICD-10-CM | POA: Diagnosis not present

## 2021-07-06 DIAGNOSIS — M6281 Muscle weakness (generalized): Secondary | ICD-10-CM | POA: Diagnosis not present

## 2021-07-06 DIAGNOSIS — G300 Alzheimer's disease with early onset: Secondary | ICD-10-CM | POA: Diagnosis not present

## 2021-07-07 DIAGNOSIS — M6281 Muscle weakness (generalized): Secondary | ICD-10-CM | POA: Diagnosis not present

## 2021-07-07 DIAGNOSIS — G300 Alzheimer's disease with early onset: Secondary | ICD-10-CM | POA: Diagnosis not present

## 2021-07-08 DIAGNOSIS — G300 Alzheimer's disease with early onset: Secondary | ICD-10-CM | POA: Diagnosis not present

## 2021-07-08 DIAGNOSIS — M6281 Muscle weakness (generalized): Secondary | ICD-10-CM | POA: Diagnosis not present

## 2021-07-11 ENCOUNTER — Ambulatory Visit (INDEPENDENT_AMBULATORY_CARE_PROVIDER_SITE_OTHER): Payer: Medicare Other | Admitting: Adult Health

## 2021-07-11 ENCOUNTER — Encounter: Payer: Self-pay | Admitting: Adult Health

## 2021-07-11 ENCOUNTER — Other Ambulatory Visit: Payer: Self-pay

## 2021-07-11 VITALS — BP 116/74 | HR 74 | Ht 61.5 in | Wt 176.0 lb

## 2021-07-11 DIAGNOSIS — Z90710 Acquired absence of both cervix and uterus: Secondary | ICD-10-CM | POA: Diagnosis not present

## 2021-07-11 DIAGNOSIS — Z960 Presence of urogenital implants: Secondary | ICD-10-CM

## 2021-07-11 DIAGNOSIS — N939 Abnormal uterine and vaginal bleeding, unspecified: Secondary | ICD-10-CM

## 2021-07-11 DIAGNOSIS — N898 Other specified noninflammatory disorders of vagina: Secondary | ICD-10-CM | POA: Diagnosis not present

## 2021-07-11 DIAGNOSIS — M6281 Muscle weakness (generalized): Secondary | ICD-10-CM | POA: Diagnosis not present

## 2021-07-11 DIAGNOSIS — G300 Alzheimer's disease with early onset: Secondary | ICD-10-CM | POA: Diagnosis not present

## 2021-07-11 DIAGNOSIS — Z90721 Acquired absence of ovaries, unilateral: Secondary | ICD-10-CM

## 2021-07-11 NOTE — Progress Notes (Signed)
°  Subjective:     Patient ID: Isabel Koch, female   DOB: 07-03-1955, 66 y.o.   MRN: [id number]  HPI Aprile is a 66 year old white female, divorced, sp hysterectomy, in for complaints of vaginal bleeding, no pain. She resides at [facility], she has Alzheimer's. She is a new pt. PCP is Dr Juleen China.  Review of Systems +vaginal bleeding, no pain Reviewed past medical,surgical, social and family history. Reviewed medications and allergies.     Objective:   Physical Exam BP 116/74 (BP Location: Left Arm, Patient Position: Sitting, Cuff Size: Normal)    Pulse 74    Ht 5' 1.5" (1.562 m)    Wt 176 lb (79.8 kg)    BMI 32.72 kg/m     Skin warm and dry.Pelvic: external genitalia is normal in appearance, she has 3 cm cluster mass, skin tone and soft, at introitus , vagina: atrophic, and short,+sling felt,Dr Despina Hidden for co exam,urethra has no lesions or masses noted,has skin changes,  cervix and uterus are absent, adnexa: no masses or tenderness noted. Bladder is non tender and no masses felt.  AA is 0 Fall risk is low Depression screen PHQ 2/9 07/11/2021  Decreased Interest 0  Down, Depressed, Hopeless 0  PHQ - 2 Score 0  Altered sleeping 0  Tired, decreased energy 0  Change in appetite 0  Feeling bad or failure about yourself  0  Trouble concentrating 0  Moving slowly or fidgety/restless 0  Suicidal thoughts 0  PHQ-9 Score 0    GAD 7 : Generalized Anxiety Score 07/11/2021  Nervous, Anxious, on Edge 0  Control/stop worrying 0  Worry too much - different things 0  Trouble relaxing 0  Restless 0  Easily annoyed or irritable 0  Afraid - awful might happen 0  Total GAD 7 Score 0    Upstream - 07/11/21 6168       Pregnancy Intention Screening   Does the patient want to become pregnant in the next year? N/A    Does the patient's partner want to become pregnant in the next year? N/A    Would the patient like to discuss contraceptive options today? N/A      Contraception  Wrap Up   Current Method Female Sterilization   hyst   End Method Female Sterilization   hyst   Contraception Counseling Provided No            Examination chaperoned by Malachy Mood LPN    Assessment:     1. Vaginal bleeding Probably secondary to mass at introitus, being irritated She uses PVC  2. S/P hysterectomy with oophorectomy  3. History of pubovaginal sling  4. Mass of vagina Return in 2 weeks to see Dr Despina Hidden for excision of mass at introitus    Plan:     Follow up with Dr Despina Hidden in 2 weeks for excision of mass at introitus, will require sutures. Explained to her and care giver and she is cooperative and agreeable

## 2021-07-12 DIAGNOSIS — G300 Alzheimer's disease with early onset: Secondary | ICD-10-CM | POA: Diagnosis not present

## 2021-07-12 DIAGNOSIS — M6281 Muscle weakness (generalized): Secondary | ICD-10-CM | POA: Diagnosis not present

## 2021-07-13 DIAGNOSIS — G300 Alzheimer's disease with early onset: Secondary | ICD-10-CM | POA: Diagnosis not present

## 2021-07-13 DIAGNOSIS — M6281 Muscle weakness (generalized): Secondary | ICD-10-CM | POA: Diagnosis not present

## 2021-07-14 DIAGNOSIS — G300 Alzheimer's disease with early onset: Secondary | ICD-10-CM | POA: Diagnosis not present

## 2021-07-14 DIAGNOSIS — M6281 Muscle weakness (generalized): Secondary | ICD-10-CM | POA: Diagnosis not present

## 2021-07-16 DIAGNOSIS — G300 Alzheimer's disease with early onset: Secondary | ICD-10-CM | POA: Diagnosis not present

## 2021-07-16 DIAGNOSIS — M6281 Muscle weakness (generalized): Secondary | ICD-10-CM | POA: Diagnosis not present

## 2021-07-18 DIAGNOSIS — G300 Alzheimer's disease with early onset: Secondary | ICD-10-CM | POA: Diagnosis not present

## 2021-07-18 DIAGNOSIS — M6281 Muscle weakness (generalized): Secondary | ICD-10-CM | POA: Diagnosis not present

## 2021-07-19 DIAGNOSIS — G300 Alzheimer's disease with early onset: Secondary | ICD-10-CM | POA: Diagnosis not present

## 2021-07-19 DIAGNOSIS — M6281 Muscle weakness (generalized): Secondary | ICD-10-CM | POA: Diagnosis not present

## 2021-07-20 DIAGNOSIS — M6281 Muscle weakness (generalized): Secondary | ICD-10-CM | POA: Diagnosis not present

## 2021-07-20 DIAGNOSIS — G300 Alzheimer's disease with early onset: Secondary | ICD-10-CM | POA: Diagnosis not present

## 2021-07-25 ENCOUNTER — Other Ambulatory Visit: Payer: Self-pay

## 2021-07-25 ENCOUNTER — Other Ambulatory Visit (HOSPITAL_COMMUNITY)
Admission: RE | Admit: 2021-07-25 | Discharge: 2021-07-25 | Disposition: A | Discharge status: Home / Self Care | Payer: Medicare Other | Source: Ambulatory Visit | Attending: Obstetrics & Gynecology | Admitting: Obstetrics & Gynecology

## 2021-07-25 ENCOUNTER — Ambulatory Visit (INDEPENDENT_AMBULATORY_CARE_PROVIDER_SITE_OTHER): Payer: Medicare Other | Admitting: Obstetrics & Gynecology

## 2021-07-25 ENCOUNTER — Encounter: Payer: Self-pay | Admitting: Obstetrics & Gynecology

## 2021-07-25 VITALS — BP 111/81 | HR 63

## 2021-07-25 DIAGNOSIS — N898 Other specified noninflammatory disorders of vagina: Secondary | ICD-10-CM

## 2021-07-25 DIAGNOSIS — L929 Granulomatous disorder of the skin and subcutaneous tissue, unspecified: Secondary | ICD-10-CM | POA: Diagnosis not present

## 2021-07-25 NOTE — Addendum Note (Signed)
Addended by: Annamarie Dawley on: 07/25/2021 12:44 PM   Modules accepted: Orders

## 2021-07-25 NOTE — Progress Notes (Signed)
PROCEDURE NOTE  PRE-OP DIAGNOSIS:  vaginal introital lesion, possible condylonata  PROCEDURE:  Skin Lesion Excision(s)  INDICATIONS:  Isabel Koch is a 66 y.o. female who presents for minor skin surgery.  The patient understands all risks, benefits, indications, potential complications, and alternatives, and freely consents for the procedure.  The patient also understands the option of performing no surgery, the risk for scarring, and the technique of the procedure.  ANESTHESIA:  Local.  TECHNIQUE:  After informed consent was obtained, and after the skin was prepped and draped, 1% lidocaine without epinephrine for anesthetic was injected around and underneath the site.   elliptical excision in total was performed. 2 figure of 8 sutures of 3-0 chromic were placed for hemostasis A dressing was applied and wound care instructions were provided.  [name] tolerated the procedure well and without complications.  The patient will be alert for any signs of cutaneous infection and will follow up as instructed.  Lazaro Arms, MD 07/25/2021 12:08 PM

## 2021-08-03 LAB — SURGICAL PATHOLOGY

## 2021-09-08 DIAGNOSIS — M6281 Muscle weakness (generalized): Secondary | ICD-10-CM | POA: Diagnosis not present

## 2021-09-08 DIAGNOSIS — G300 Alzheimer's disease with early onset: Secondary | ICD-10-CM | POA: Diagnosis not present

## 2021-09-09 DIAGNOSIS — G300 Alzheimer's disease with early onset: Secondary | ICD-10-CM | POA: Diagnosis not present

## 2021-09-09 DIAGNOSIS — M6281 Muscle weakness (generalized): Secondary | ICD-10-CM | POA: Diagnosis not present

## 2021-09-12 DIAGNOSIS — G300 Alzheimer's disease with early onset: Secondary | ICD-10-CM | POA: Diagnosis not present

## 2021-09-12 DIAGNOSIS — M6281 Muscle weakness (generalized): Secondary | ICD-10-CM | POA: Diagnosis not present

## 2021-09-13 DIAGNOSIS — G300 Alzheimer's disease with early onset: Secondary | ICD-10-CM | POA: Diagnosis not present

## 2021-09-13 DIAGNOSIS — M6281 Muscle weakness (generalized): Secondary | ICD-10-CM | POA: Diagnosis not present

## 2021-09-14 DIAGNOSIS — M6281 Muscle weakness (generalized): Secondary | ICD-10-CM | POA: Diagnosis not present

## 2021-09-14 DIAGNOSIS — G300 Alzheimer's disease with early onset: Secondary | ICD-10-CM | POA: Diagnosis not present

## 2021-09-15 DIAGNOSIS — G300 Alzheimer's disease with early onset: Secondary | ICD-10-CM | POA: Diagnosis not present

## 2021-09-15 DIAGNOSIS — M6281 Muscle weakness (generalized): Secondary | ICD-10-CM | POA: Diagnosis not present

## 2021-09-16 DIAGNOSIS — G300 Alzheimer's disease with early onset: Secondary | ICD-10-CM | POA: Diagnosis not present

## 2021-09-16 DIAGNOSIS — M6281 Muscle weakness (generalized): Secondary | ICD-10-CM | POA: Diagnosis not present

## 2021-09-19 DIAGNOSIS — M6281 Muscle weakness (generalized): Secondary | ICD-10-CM | POA: Diagnosis not present

## 2021-09-19 DIAGNOSIS — G300 Alzheimer's disease with early onset: Secondary | ICD-10-CM | POA: Diagnosis not present

## 2021-09-20 DIAGNOSIS — G300 Alzheimer's disease with early onset: Secondary | ICD-10-CM | POA: Diagnosis not present

## 2021-09-20 DIAGNOSIS — M6281 Muscle weakness (generalized): Secondary | ICD-10-CM | POA: Diagnosis not present

## 2021-09-21 DIAGNOSIS — M6281 Muscle weakness (generalized): Secondary | ICD-10-CM | POA: Diagnosis not present

## 2021-09-21 DIAGNOSIS — G300 Alzheimer's disease with early onset: Secondary | ICD-10-CM | POA: Diagnosis not present

## 2021-09-23 DIAGNOSIS — N189 Chronic kidney disease, unspecified: Secondary | ICD-10-CM | POA: Diagnosis not present

## 2021-09-23 DIAGNOSIS — E785 Hyperlipidemia, unspecified: Secondary | ICD-10-CM | POA: Diagnosis not present

## 2021-10-13 DIAGNOSIS — R8290 Unspecified abnormal findings in urine: Secondary | ICD-10-CM | POA: Diagnosis not present

## 2021-11-02 DIAGNOSIS — N390 Urinary tract infection, site not specified: Secondary | ICD-10-CM | POA: Diagnosis not present

## 2021-11-13 DIAGNOSIS — R059 Cough, unspecified: Secondary | ICD-10-CM | POA: Diagnosis not present

## 2021-11-23 DIAGNOSIS — M6281 Muscle weakness (generalized): Secondary | ICD-10-CM | POA: Diagnosis not present

## 2021-11-23 DIAGNOSIS — G300 Alzheimer's disease with early onset: Secondary | ICD-10-CM | POA: Diagnosis not present

## 2021-11-24 DIAGNOSIS — M6281 Muscle weakness (generalized): Secondary | ICD-10-CM | POA: Diagnosis not present

## 2021-11-24 DIAGNOSIS — G300 Alzheimer's disease with early onset: Secondary | ICD-10-CM | POA: Diagnosis not present

## 2021-11-25 DIAGNOSIS — M6281 Muscle weakness (generalized): Secondary | ICD-10-CM | POA: Diagnosis not present

## 2021-11-25 DIAGNOSIS — G300 Alzheimer's disease with early onset: Secondary | ICD-10-CM | POA: Diagnosis not present

## 2021-11-28 DIAGNOSIS — M6281 Muscle weakness (generalized): Secondary | ICD-10-CM | POA: Diagnosis not present

## 2021-11-28 DIAGNOSIS — G300 Alzheimer's disease with early onset: Secondary | ICD-10-CM | POA: Diagnosis not present

## 2021-11-29 DIAGNOSIS — M6281 Muscle weakness (generalized): Secondary | ICD-10-CM | POA: Diagnosis not present

## 2021-11-29 DIAGNOSIS — L03032 Cellulitis of left toe: Secondary | ICD-10-CM | POA: Diagnosis not present

## 2021-11-29 DIAGNOSIS — G300 Alzheimer's disease with early onset: Secondary | ICD-10-CM | POA: Diagnosis not present

## 2021-11-29 DIAGNOSIS — M79675 Pain in left toe(s): Secondary | ICD-10-CM | POA: Diagnosis not present

## 2021-11-30 DIAGNOSIS — M6281 Muscle weakness (generalized): Secondary | ICD-10-CM | POA: Diagnosis not present

## 2021-11-30 DIAGNOSIS — G300 Alzheimer's disease with early onset: Secondary | ICD-10-CM | POA: Diagnosis not present

## 2021-12-01 DIAGNOSIS — M6281 Muscle weakness (generalized): Secondary | ICD-10-CM | POA: Diagnosis not present

## 2021-12-01 DIAGNOSIS — G300 Alzheimer's disease with early onset: Secondary | ICD-10-CM | POA: Diagnosis not present

## 2021-12-02 DIAGNOSIS — M6281 Muscle weakness (generalized): Secondary | ICD-10-CM | POA: Diagnosis not present

## 2021-12-02 DIAGNOSIS — G300 Alzheimer's disease with early onset: Secondary | ICD-10-CM | POA: Diagnosis not present

## 2021-12-06 DIAGNOSIS — M6281 Muscle weakness (generalized): Secondary | ICD-10-CM | POA: Diagnosis not present

## 2021-12-06 DIAGNOSIS — G300 Alzheimer's disease with early onset: Secondary | ICD-10-CM | POA: Diagnosis not present

## 2021-12-07 DIAGNOSIS — M6281 Muscle weakness (generalized): Secondary | ICD-10-CM | POA: Diagnosis not present

## 2021-12-07 DIAGNOSIS — G300 Alzheimer's disease with early onset: Secondary | ICD-10-CM | POA: Diagnosis not present

## 2021-12-08 DIAGNOSIS — M6281 Muscle weakness (generalized): Secondary | ICD-10-CM | POA: Diagnosis not present

## 2021-12-08 DIAGNOSIS — G300 Alzheimer's disease with early onset: Secondary | ICD-10-CM | POA: Diagnosis not present

## 2021-12-09 DIAGNOSIS — M6281 Muscle weakness (generalized): Secondary | ICD-10-CM | POA: Diagnosis not present

## 2021-12-09 DIAGNOSIS — G300 Alzheimer's disease with early onset: Secondary | ICD-10-CM | POA: Diagnosis not present

## 2021-12-10 DIAGNOSIS — G300 Alzheimer's disease with early onset: Secondary | ICD-10-CM | POA: Diagnosis not present

## 2021-12-10 DIAGNOSIS — M6281 Muscle weakness (generalized): Secondary | ICD-10-CM | POA: Diagnosis not present

## 2021-12-12 DIAGNOSIS — M6281 Muscle weakness (generalized): Secondary | ICD-10-CM | POA: Diagnosis not present

## 2021-12-12 DIAGNOSIS — G300 Alzheimer's disease with early onset: Secondary | ICD-10-CM | POA: Diagnosis not present

## 2021-12-14 DIAGNOSIS — M6281 Muscle weakness (generalized): Secondary | ICD-10-CM | POA: Diagnosis not present

## 2021-12-14 DIAGNOSIS — G300 Alzheimer's disease with early onset: Secondary | ICD-10-CM | POA: Diagnosis not present

## 2021-12-15 DIAGNOSIS — G300 Alzheimer's disease with early onset: Secondary | ICD-10-CM | POA: Diagnosis not present

## 2021-12-15 DIAGNOSIS — M6281 Muscle weakness (generalized): Secondary | ICD-10-CM | POA: Diagnosis not present

## 2021-12-16 DIAGNOSIS — M6281 Muscle weakness (generalized): Secondary | ICD-10-CM | POA: Diagnosis not present

## 2021-12-16 DIAGNOSIS — G300 Alzheimer's disease with early onset: Secondary | ICD-10-CM | POA: Diagnosis not present

## 2021-12-19 DIAGNOSIS — G300 Alzheimer's disease with early onset: Secondary | ICD-10-CM | POA: Diagnosis not present

## 2021-12-19 DIAGNOSIS — M6281 Muscle weakness (generalized): Secondary | ICD-10-CM | POA: Diagnosis not present

## 2021-12-20 DIAGNOSIS — M6281 Muscle weakness (generalized): Secondary | ICD-10-CM | POA: Diagnosis not present

## 2021-12-20 DIAGNOSIS — G300 Alzheimer's disease with early onset: Secondary | ICD-10-CM | POA: Diagnosis not present

## 2022-02-01 DIAGNOSIS — G300 Alzheimer's disease with early onset: Secondary | ICD-10-CM | POA: Diagnosis not present

## 2022-02-01 DIAGNOSIS — R4701 Aphasia: Secondary | ICD-10-CM | POA: Diagnosis not present

## 2022-02-02 DIAGNOSIS — R4701 Aphasia: Secondary | ICD-10-CM | POA: Diagnosis not present

## 2022-02-02 DIAGNOSIS — G300 Alzheimer's disease with early onset: Secondary | ICD-10-CM | POA: Diagnosis not present

## 2022-02-03 DIAGNOSIS — R4701 Aphasia: Secondary | ICD-10-CM | POA: Diagnosis not present

## 2022-02-03 DIAGNOSIS — G300 Alzheimer's disease with early onset: Secondary | ICD-10-CM | POA: Diagnosis not present

## 2022-02-06 DIAGNOSIS — R4701 Aphasia: Secondary | ICD-10-CM | POA: Diagnosis not present

## 2022-02-06 DIAGNOSIS — G300 Alzheimer's disease with early onset: Secondary | ICD-10-CM | POA: Diagnosis not present

## 2022-02-07 DIAGNOSIS — R4701 Aphasia: Secondary | ICD-10-CM | POA: Diagnosis not present

## 2022-02-07 DIAGNOSIS — G300 Alzheimer's disease with early onset: Secondary | ICD-10-CM | POA: Diagnosis not present

## 2022-02-08 DIAGNOSIS — K219 Gastro-esophageal reflux disease without esophagitis: Secondary | ICD-10-CM | POA: Diagnosis not present

## 2022-02-08 DIAGNOSIS — R4701 Aphasia: Secondary | ICD-10-CM | POA: Diagnosis not present

## 2022-02-08 DIAGNOSIS — G300 Alzheimer's disease with early onset: Secondary | ICD-10-CM | POA: Diagnosis not present

## 2022-02-08 DIAGNOSIS — E785 Hyperlipidemia, unspecified: Secondary | ICD-10-CM | POA: Diagnosis not present

## 2022-02-08 DIAGNOSIS — G309 Alzheimer's disease, unspecified: Secondary | ICD-10-CM | POA: Diagnosis not present

## 2022-02-09 DIAGNOSIS — R4701 Aphasia: Secondary | ICD-10-CM | POA: Diagnosis not present

## 2022-02-09 DIAGNOSIS — G300 Alzheimer's disease with early onset: Secondary | ICD-10-CM | POA: Diagnosis not present

## 2022-02-10 DIAGNOSIS — G300 Alzheimer's disease with early onset: Secondary | ICD-10-CM | POA: Diagnosis not present

## 2022-02-10 DIAGNOSIS — R4701 Aphasia: Secondary | ICD-10-CM | POA: Diagnosis not present

## 2022-02-13 DIAGNOSIS — R4701 Aphasia: Secondary | ICD-10-CM | POA: Diagnosis not present

## 2022-02-13 DIAGNOSIS — G300 Alzheimer's disease with early onset: Secondary | ICD-10-CM | POA: Diagnosis not present

## 2022-02-14 DIAGNOSIS — R4701 Aphasia: Secondary | ICD-10-CM | POA: Diagnosis not present

## 2022-02-14 DIAGNOSIS — G300 Alzheimer's disease with early onset: Secondary | ICD-10-CM | POA: Diagnosis not present

## 2022-02-15 DIAGNOSIS — G300 Alzheimer's disease with early onset: Secondary | ICD-10-CM | POA: Diagnosis not present

## 2022-02-15 DIAGNOSIS — R4701 Aphasia: Secondary | ICD-10-CM | POA: Diagnosis not present

## 2022-02-16 DIAGNOSIS — G300 Alzheimer's disease with early onset: Secondary | ICD-10-CM | POA: Diagnosis not present

## 2022-02-16 DIAGNOSIS — R4701 Aphasia: Secondary | ICD-10-CM | POA: Diagnosis not present

## 2022-02-17 DIAGNOSIS — R4701 Aphasia: Secondary | ICD-10-CM | POA: Diagnosis not present

## 2022-02-17 DIAGNOSIS — G300 Alzheimer's disease with early onset: Secondary | ICD-10-CM | POA: Diagnosis not present

## 2022-02-20 DIAGNOSIS — R4701 Aphasia: Secondary | ICD-10-CM | POA: Diagnosis not present

## 2022-02-20 DIAGNOSIS — G300 Alzheimer's disease with early onset: Secondary | ICD-10-CM | POA: Diagnosis not present

## 2022-02-21 DIAGNOSIS — G300 Alzheimer's disease with early onset: Secondary | ICD-10-CM | POA: Diagnosis not present

## 2022-02-21 DIAGNOSIS — R4701 Aphasia: Secondary | ICD-10-CM | POA: Diagnosis not present

## 2022-03-02 DIAGNOSIS — E785 Hyperlipidemia, unspecified: Secondary | ICD-10-CM | POA: Diagnosis not present

## 2022-03-02 DIAGNOSIS — K219 Gastro-esophageal reflux disease without esophagitis: Secondary | ICD-10-CM | POA: Diagnosis not present

## 2022-03-02 DIAGNOSIS — Z79899 Other long term (current) drug therapy: Secondary | ICD-10-CM | POA: Diagnosis not present

## 2022-03-02 DIAGNOSIS — G309 Alzheimer's disease, unspecified: Secondary | ICD-10-CM | POA: Diagnosis not present

## 2022-03-21 DIAGNOSIS — Z23 Encounter for immunization: Secondary | ICD-10-CM | POA: Diagnosis not present

## 2022-04-10 DIAGNOSIS — U071 COVID-19: Secondary | ICD-10-CM | POA: Diagnosis not present

## 2022-05-08 DIAGNOSIS — G300 Alzheimer's disease with early onset: Secondary | ICD-10-CM | POA: Diagnosis not present

## 2022-05-08 DIAGNOSIS — M6281 Muscle weakness (generalized): Secondary | ICD-10-CM | POA: Diagnosis not present

## 2022-05-08 DIAGNOSIS — Z780 Asymptomatic menopausal state: Secondary | ICD-10-CM | POA: Diagnosis not present

## 2022-05-08 DIAGNOSIS — K5900 Constipation, unspecified: Secondary | ICD-10-CM | POA: Diagnosis not present

## 2022-05-08 DIAGNOSIS — Z09 Encounter for follow-up examination after completed treatment for conditions other than malignant neoplasm: Secondary | ICD-10-CM | POA: Diagnosis not present

## 2022-05-09 DIAGNOSIS — G300 Alzheimer's disease with early onset: Secondary | ICD-10-CM | POA: Diagnosis not present

## 2022-05-09 DIAGNOSIS — M6281 Muscle weakness (generalized): Secondary | ICD-10-CM | POA: Diagnosis not present

## 2022-05-10 DIAGNOSIS — G300 Alzheimer's disease with early onset: Secondary | ICD-10-CM | POA: Diagnosis not present

## 2022-05-10 DIAGNOSIS — F32A Depression, unspecified: Secondary | ICD-10-CM | POA: Diagnosis not present

## 2022-05-10 DIAGNOSIS — E785 Hyperlipidemia, unspecified: Secondary | ICD-10-CM | POA: Diagnosis not present

## 2022-05-10 DIAGNOSIS — M6281 Muscle weakness (generalized): Secondary | ICD-10-CM | POA: Diagnosis not present

## 2022-05-10 DIAGNOSIS — G309 Alzheimer's disease, unspecified: Secondary | ICD-10-CM | POA: Diagnosis not present

## 2022-05-10 DIAGNOSIS — K219 Gastro-esophageal reflux disease without esophagitis: Secondary | ICD-10-CM | POA: Diagnosis not present

## 2022-05-11 DIAGNOSIS — M6281 Muscle weakness (generalized): Secondary | ICD-10-CM | POA: Diagnosis not present

## 2022-05-11 DIAGNOSIS — G300 Alzheimer's disease with early onset: Secondary | ICD-10-CM | POA: Diagnosis not present

## 2022-05-12 DIAGNOSIS — G300 Alzheimer's disease with early onset: Secondary | ICD-10-CM | POA: Diagnosis not present

## 2022-05-12 DIAGNOSIS — M6281 Muscle weakness (generalized): Secondary | ICD-10-CM | POA: Diagnosis not present

## 2022-05-15 DIAGNOSIS — G300 Alzheimer's disease with early onset: Secondary | ICD-10-CM | POA: Diagnosis not present

## 2022-05-15 DIAGNOSIS — M6281 Muscle weakness (generalized): Secondary | ICD-10-CM | POA: Diagnosis not present

## 2022-05-17 DIAGNOSIS — M6281 Muscle weakness (generalized): Secondary | ICD-10-CM | POA: Diagnosis not present

## 2022-05-17 DIAGNOSIS — G300 Alzheimer's disease with early onset: Secondary | ICD-10-CM | POA: Diagnosis not present

## 2022-05-19 DIAGNOSIS — M6281 Muscle weakness (generalized): Secondary | ICD-10-CM | POA: Diagnosis not present

## 2022-05-19 DIAGNOSIS — G300 Alzheimer's disease with early onset: Secondary | ICD-10-CM | POA: Diagnosis not present

## 2022-05-20 DIAGNOSIS — M6281 Muscle weakness (generalized): Secondary | ICD-10-CM | POA: Diagnosis not present

## 2022-05-20 DIAGNOSIS — G300 Alzheimer's disease with early onset: Secondary | ICD-10-CM | POA: Diagnosis not present

## 2022-05-21 DIAGNOSIS — M6281 Muscle weakness (generalized): Secondary | ICD-10-CM | POA: Diagnosis not present

## 2022-05-21 DIAGNOSIS — G300 Alzheimer's disease with early onset: Secondary | ICD-10-CM | POA: Diagnosis not present

## 2022-05-22 DIAGNOSIS — G300 Alzheimer's disease with early onset: Secondary | ICD-10-CM | POA: Diagnosis not present

## 2022-05-22 DIAGNOSIS — M6281 Muscle weakness (generalized): Secondary | ICD-10-CM | POA: Diagnosis not present

## 2022-05-23 DIAGNOSIS — G300 Alzheimer's disease with early onset: Secondary | ICD-10-CM | POA: Diagnosis not present

## 2022-05-23 DIAGNOSIS — M6281 Muscle weakness (generalized): Secondary | ICD-10-CM | POA: Diagnosis not present

## 2022-05-24 DIAGNOSIS — G300 Alzheimer's disease with early onset: Secondary | ICD-10-CM | POA: Diagnosis not present

## 2022-05-24 DIAGNOSIS — M6281 Muscle weakness (generalized): Secondary | ICD-10-CM | POA: Diagnosis not present

## 2022-05-25 DIAGNOSIS — Z7189 Other specified counseling: Secondary | ICD-10-CM | POA: Diagnosis not present

## 2022-05-25 DIAGNOSIS — G309 Alzheimer's disease, unspecified: Secondary | ICD-10-CM | POA: Diagnosis not present

## 2022-05-25 DIAGNOSIS — K219 Gastro-esophageal reflux disease without esophagitis: Secondary | ICD-10-CM | POA: Diagnosis not present

## 2022-05-25 DIAGNOSIS — Z79899 Other long term (current) drug therapy: Secondary | ICD-10-CM | POA: Diagnosis not present

## 2022-05-25 DIAGNOSIS — E785 Hyperlipidemia, unspecified: Secondary | ICD-10-CM | POA: Diagnosis not present

## 2022-05-26 DIAGNOSIS — M6281 Muscle weakness (generalized): Secondary | ICD-10-CM | POA: Diagnosis not present

## 2022-05-26 DIAGNOSIS — G300 Alzheimer's disease with early onset: Secondary | ICD-10-CM | POA: Diagnosis not present

## 2022-05-29 DIAGNOSIS — G300 Alzheimer's disease with early onset: Secondary | ICD-10-CM | POA: Diagnosis not present

## 2022-05-29 DIAGNOSIS — M6281 Muscle weakness (generalized): Secondary | ICD-10-CM | POA: Diagnosis not present

## 2022-05-30 DIAGNOSIS — M6281 Muscle weakness (generalized): Secondary | ICD-10-CM | POA: Diagnosis not present

## 2022-05-30 DIAGNOSIS — G300 Alzheimer's disease with early onset: Secondary | ICD-10-CM | POA: Diagnosis not present

## 2022-05-31 DIAGNOSIS — M6281 Muscle weakness (generalized): Secondary | ICD-10-CM | POA: Diagnosis not present

## 2022-05-31 DIAGNOSIS — G300 Alzheimer's disease with early onset: Secondary | ICD-10-CM | POA: Diagnosis not present

## 2022-06-01 DIAGNOSIS — G300 Alzheimer's disease with early onset: Secondary | ICD-10-CM | POA: Diagnosis not present

## 2022-06-01 DIAGNOSIS — M6281 Muscle weakness (generalized): Secondary | ICD-10-CM | POA: Diagnosis not present

## 2022-06-02 DIAGNOSIS — G300 Alzheimer's disease with early onset: Secondary | ICD-10-CM | POA: Diagnosis not present

## 2022-06-02 DIAGNOSIS — M6281 Muscle weakness (generalized): Secondary | ICD-10-CM | POA: Diagnosis not present

## 2022-06-06 DIAGNOSIS — M6281 Muscle weakness (generalized): Secondary | ICD-10-CM | POA: Diagnosis not present

## 2022-06-06 DIAGNOSIS — G300 Alzheimer's disease with early onset: Secondary | ICD-10-CM | POA: Diagnosis not present

## 2022-06-10 DIAGNOSIS — M6281 Muscle weakness (generalized): Secondary | ICD-10-CM | POA: Diagnosis not present

## 2022-06-10 DIAGNOSIS — G300 Alzheimer's disease with early onset: Secondary | ICD-10-CM | POA: Diagnosis not present

## 2022-09-15 ENCOUNTER — Encounter (HOSPITAL_COMMUNITY): Payer: Self-pay

## 2022-09-15 ENCOUNTER — Other Ambulatory Visit: Payer: Self-pay

## 2022-09-15 ENCOUNTER — Emergency Department (HOSPITAL_COMMUNITY): Payer: Medicare Other

## 2022-09-15 ENCOUNTER — Emergency Department (HOSPITAL_COMMUNITY)
Admission: EM | Admit: 2022-09-15 | Discharge: 2022-09-15 | Disposition: A | Discharge status: Skilled Nursing Facility | Payer: Medicare Other | Attending: Student | Admitting: Student

## 2022-09-15 DIAGNOSIS — R1032 Left lower quadrant pain: Secondary | ICD-10-CM | POA: Insufficient documentation

## 2022-09-15 DIAGNOSIS — R4182 Altered mental status, unspecified: Secondary | ICD-10-CM | POA: Diagnosis present

## 2022-09-15 DIAGNOSIS — F03B4 Unspecified dementia, moderate, with anxiety: Secondary | ICD-10-CM

## 2022-09-15 DIAGNOSIS — E039 Hypothyroidism, unspecified: Secondary | ICD-10-CM | POA: Diagnosis not present

## 2022-09-15 DIAGNOSIS — R0682 Tachypnea, not elsewhere classified: Secondary | ICD-10-CM | POA: Diagnosis not present

## 2022-09-15 LAB — COMPREHENSIVE METABOLIC PANEL
ALT: 28 U/L (ref 0–44)
AST: 33 U/L (ref 15–41)
Albumin: 3.7 g/dL (ref 3.5–5.0)
Alkaline Phosphatase: 66 U/L (ref 38–126)
Anion gap: 10 (ref 5–15)
BUN: 10 mg/dL (ref 8–23)
CO2: 22 mmol/L (ref 22–32)
Calcium: 9.1 mg/dL (ref 8.9–10.3)
Chloride: 106 mmol/L (ref 98–111)
Creatinine, Ser: 0.92 mg/dL (ref 0.44–1.00)
GFR, Estimated: >60 mL/min (ref >60)
Glucose, Bld: 137 mg/dL — ABNORMAL HIGH (ref 70–99)
Potassium: 3.9 mmol/L (ref 3.5–5.1)
Sodium: 138 mmol/L (ref 135–145)
Total Bilirubin: 0.8 mg/dL (ref 0.3–1.2)
Total Protein: 8.1 g/dL (ref 6.5–8.1)

## 2022-09-15 LAB — URINALYSIS, ROUTINE W REFLEX MICROSCOPIC
Bilirubin Urine: NEGATIVE
Glucose, UA: NEGATIVE mg/dL
Hgb urine dipstick: NEGATIVE
Ketones, ur: NEGATIVE mg/dL
Leukocytes,Ua: NEGATIVE
Nitrite: NEGATIVE
Protein, ur: NEGATIVE mg/dL
Specific Gravity, Urine: 1.033 — ABNORMAL HIGH (ref 1.005–1.030)
pH: 8 (ref 5.0–8.0)

## 2022-09-15 LAB — CBC WITH DIFFERENTIAL/PLATELET
Abs Immature Granulocytes: 0.05 10*3/uL (ref 0.00–0.07)
Basophils Absolute: 0 10*3/uL (ref 0.0–0.1)
Basophils Relative: 1 %
Eosinophils Absolute: 0.1 10*3/uL (ref 0.0–0.5)
Eosinophils Relative: 3 %
HCT: 37.6 % (ref 36.0–46.0)
Hemoglobin: 12.6 g/dL (ref 12.0–15.0)
Immature Granulocytes: 1 %
Lymphocytes Relative: 29 %
Lymphs Abs: 1.2 10*3/uL (ref 0.7–4.0)
MCH: 29 pg (ref 26.0–34.0)
MCHC: 33.5 g/dL (ref 30.0–36.0)
MCV: 86.6 fL (ref 80.0–100.0)
Monocytes Absolute: 0.3 10*3/uL (ref 0.1–1.0)
Monocytes Relative: 7 %
Neutro Abs: 2.5 10*3/uL (ref 1.7–7.7)
Neutrophils Relative %: 59 %
Platelets: 276 10*3/uL (ref 150–400)
RBC: 4.34 MIL/uL (ref 3.87–5.11)
RDW: 13.8 % (ref 11.5–15.5)
WBC: 4.2 10*3/uL (ref 4.0–10.5)
nRBC: 0 % (ref 0.0–0.2)

## 2022-09-15 LAB — RAPID URINE DRUG SCREEN, HOSP PERFORMED
Amphetamines: NOT DETECTED
Barbiturates: NOT DETECTED
Benzodiazepines: NOT DETECTED
Cocaine: NOT DETECTED
Opiates: NOT DETECTED
Tetrahydrocannabinol: NOT DETECTED

## 2022-09-15 LAB — TSH: TSH: 0.049 u[IU]/mL — ABNORMAL LOW (ref 0.350–4.500)

## 2022-09-15 MED ORDER — LORAZEPAM 2 MG/ML IJ SOLN
1.0000 mg | Freq: Once | INTRAMUSCULAR | Status: AC
  Administered 2022-09-15: 1 mg via INTRAVENOUS
  Filled 2022-09-15: qty 1

## 2022-09-15 MED ORDER — LORAZEPAM 2 MG/ML IJ SOLN
0.5000 mg | Freq: Once | INTRAMUSCULAR | Status: AC
  Administered 2022-09-15: 0.5 mg via INTRAVENOUS
  Filled 2022-09-15: qty 1

## 2022-09-15 MED ORDER — IOHEXOL 300 MG/ML  SOLN
100.0000 mL | Freq: Once | INTRAMUSCULAR | Status: AC | PRN
  Administered 2022-09-15: 100 mL via INTRAVENOUS

## 2022-09-15 MED ORDER — FENTANYL CITRATE PF 50 MCG/ML IJ SOSY
50.0000 ug | PREFILLED_SYRINGE | Freq: Once | INTRAMUSCULAR | Status: AC
  Administered 2022-09-15: 50 ug via INTRAVENOUS
  Filled 2022-09-15: qty 1

## 2022-09-15 MED ORDER — LACTATED RINGERS IV BOLUS
1000.0000 mL | Freq: Once | INTRAVENOUS | Status: AC
  Administered 2022-09-15: 1000 mL via INTRAVENOUS

## 2022-09-15 NOTE — ED Triage Notes (Signed)
Patient from [facility] Per EMS nurse called stating patient is not acting her usual. Nurse informed EMS that patient was drooling when going to give her morning medications. Per EMS patient is in early onset of alzheimer's. Patient is very anxious at this time to answer questions.

## 2022-09-15 NOTE — ED Notes (Signed)
Patient transported to CT 

## 2022-09-15 NOTE — ED Notes (Signed)
Attempted to give [facility] report with no answer

## 2022-09-15 NOTE — ED Notes (Signed)
RN walked pt to bathroom for UA sample. Pt was incontinent in brief and bed. Linen chang performed.

## 2022-09-15 NOTE — ED Provider Notes (Signed)
[facility] EMERGENCY DEPARTMENT AT [facility] Provider Note  CSN: [id number] Arrival date & time: 09/15/22 0715  Chief Complaint(s) Altered Mental Status  HPI Isabel Koch is a 67 y.o. female with PMH vaginal vaginal prolapse sphincteroplasty and colpocleisis, hypothyroidism, Alzheimer's dementia currently in a skilled nursing facility who presents emergency department for evaluation of altered mental status.  [facility] and when staff attempted to give the patient her medication this morning, patient started drooling and panicking.  Patient arrives very anxious is alert to self but not time or place.  Unable to provide further history secondary to underlying dementia.   Past Medical History Past Medical History:  Diagnosis Date   ADHD (attention deficit hyperactivity disorder)    Alzheimer disease    Hypothyroidism    Urinary retention    Patient Active Problem List   Diagnosis Date Noted   Mass of vagina 07/11/2021   History of pubovaginal sling 07/11/2021   S/P hysterectomy with oophorectomy 07/11/2021   Vaginal bleeding 07/11/2021   Pelvic prolapse 04/30/2014   Home Medication(s) Prior to Admission medications   Medication Sig Start Date End Date Taking? Authorizing Provider  atorvastatin (LIPITOR) 10 MG tablet Take 10 mg by mouth daily.    [provider]  calcium carbonate (TUMS - DOSED IN MG ELEMENTAL CALCIUM) 500 MG chewable tablet Chew 2 tablets by mouth 3 (three) times daily with meals.    [provider]  citalopram (CELEXA) 20 MG tablet Take 20 mg by mouth daily.    [provider]  donepezil (ARICEPT) 10 MG tablet Take 10 mg by mouth at bedtime.    [provider]  estradiol (ESTRACE) 0.1 MG/GM vaginal cream Place 1 Applicatorful vaginally at bedtime.    [provider]  famotidine (PEPCID) 20 MG tablet Take 20 mg by mouth daily.    [provider]  memantine  (NAMENDA) 10 MG tablet Take 10 mg by mouth 2 (two) times daily.    [provider]  nystatin (MYCOSTATIN/NYSTOP) powder Apply 1 application topically 2 (two) times daily.    [provider]                                                                                                                                    Past Surgical History Past Surgical History:  Procedure Laterality Date   APPENDECTOMY     PUBOVAGINAL SLING N/A 05/12/2014   Procedure: INCISION OF PUBO-VAGINAL SLING;  Surgeon: Kathi LudwigSigmund I Tannenbaum, MD;  [facility];  Service: Urology;  Laterality: N/A;   VAGINAL HYSTERECTOMY Bilateral 04/30/2014   Procedure: HYSTERECTOMY VAGINAL WITH BILATERAL SALPINGECTOMY;  Surgeon: Serita KyleSheronette A Cousins, MD;  Location: [facility];  Service: Gynecology;  Laterality: Bilateral;   VAGINAL PROLAPSE REPAIR N/A 04/30/2014   Procedure: ANTERIOR VAULT REPAIR KELLY PLICATION A CELL GRAFT  AUGMETATION ,SACROSPINUS FIXATION AND LYNX SLING ;  Surgeon: Kathi Ludwig, MD;  Location: [facility];  Service: Urology;  Laterality: N/A;   Family History Family History  Problem Relation Age of Onset   Thyroid disease Mother    Thyroid disease Sister     Social History Social History   Tobacco Use   Smoking status: Never   Smokeless tobacco: Never  Vaping Use   Vaping Use: Never used  Substance Use Topics   Alcohol use: Not Currently    Alcohol/week: 2.0 standard drinks of alcohol    Types: 2 Glasses of wine per week   Drug use: No   Allergies Sulfa antibiotics  Review of Systems Review of Systems  Unable to perform ROS: Dementia    Physical Exam Vital Signs  I have reviewed the triage vital signs BP 106/89   Pulse 69   Temp 97.7 F (36.5 C) (Oral)   Resp 17   Ht 5\' 5"  (1.651 m)   Wt 79.1 kg   SpO2 99%   BMI 29.02 kg/m   Physical Exam Vitals and nursing note reviewed.  Constitutional:      General: She is in acute distress.      Appearance: She is well-developed.  HENT:     Head: Normocephalic and atraumatic.  Eyes:     Conjunctiva/sclera: Conjunctivae normal.  Cardiovascular:     Rate and Rhythm: Normal rate and regular rhythm.     Heart sounds: No murmur heard. Pulmonary:     Effort: Pulmonary effort is normal. No respiratory distress.     Breath sounds: Normal breath sounds.  Abdominal:     Palpations: Abdomen is soft.     Tenderness: There is no abdominal tenderness.  Musculoskeletal:        General: No swelling.     Cervical back: Neck supple.  Skin:    General: Skin is warm and dry.     Capillary Refill: Capillary refill takes less than 2 seconds.  Neurological:     Mental Status: She is alert.  Psychiatric:     Comments: Anxious, tachypneic     ED Results and Treatments Labs (all labs ordered are listed, but only abnormal results are displayed) Labs Reviewed  COMPREHENSIVE METABOLIC PANEL - Abnormal; Notable for the following components:      Result Value   Glucose, Bld 137 (*)    All other components within normal limits  URINALYSIS, ROUTINE W REFLEX MICROSCOPIC - Abnormal; Notable for the following components:   Color, Urine STRAW (*)    Specific Gravity, Urine 1.033 (*)    All other components within normal limits  TSH - Abnormal; Notable for the following components:   TSH 0.049 (*)    All other components within normal limits  CBC WITH DIFFERENTIAL/PLATELET  RAPID URINE DRUG SCREEN, HOSP PERFORMED  Radiology CT ABDOMEN PELVIS W CONTRAST  Result Date: 09/15/2022 CLINICAL DATA:  67 year old female with left lower quadrant abdominal pain. Altered mental status. EXAM: CT ABDOMEN AND PELVIS WITH CONTRAST TECHNIQUE: Multidetector CT imaging of the abdomen and pelvis was performed using the standard protocol following bolus administration of intravenous contrast.  RADIATION DOSE REDUCTION: This exam was performed according to the departmental dose-optimization program which includes automated exposure control, adjustment of the mA and/or kV according to patient size and/or use of iterative reconstruction technique. CONTRAST:  100mL OMNIPAQUE IOHEXOL 300 MG/ML  SOLN COMPARISON:  CT Abdomen and Pelvis 11/28/2013. FINDINGS: Lower chest: Increased tortuosity of the descending thoracic aorta since 2015. Mild cardiomegaly. No pericardial or pleural effusion. Symmetric dependent lung base opacity more resembles atelectasis than infection. Hepatobiliary: Negative liver and gallbladder. Pancreas: Negative. Spleen: Negative. Adrenals/Urinary Tract: Normal adrenal glands. Chronic asymmetry of the renal pelves appears unchanged since 2015. Renal enhancement is symmetric. No pararenal inflammation. No nephrolithiasis. Both proximal ureters appear decompressed. Diminutive bladder. Incidental pelvic floor laxity, see sagittal image 75. normal contrast excretion to the bladder on delayed images. Stomach/Bowel: Mild motion artifact in the pelvis decompressed descending and rectosigmoid colon. No diverticulosis in those segments. Upstream transverse and right colon with mild retained gas and stool. Diminutive or absent appendix. No large bowel inflammation identified. Decompressed terminal ileum. No dilated small bowel. Small gastric hiatal hernia versus phrenic ampulla (normal variant). Fairly decompressed stomach and duodenum. No free air, free fluid, or mesenteric inflammation identified. Vascular/Lymphatic: Tortuous abdominal aorta caliber remains within normal limits. Mild calcified iliac artery atherosclerosis. Major arterial structures are patent. Portal venous system appears patent. No lymphadenopathy identified. Reproductive: Diminutive or absent. Other: No pelvis free fluid. Musculoskeletal: Spine degeneration, dextroconvex lumbar scoliosis. Osteopenia. No acute osseous abnormality  identified. IMPRESSION: 1. Mild motion artifact in the pelvis. No acute or inflammatory process identified. 2. Symmetric lung base opacity most compatible with atelectasis. Electronically Signed   By: Odessa FlemingH  Hall M.D.   On: 09/15/2022 09:41   CT Head Wo Contrast  Result Date: 09/15/2022 CLINICAL DATA:  Delirium EXAM: CT HEAD WITHOUT CONTRAST TECHNIQUE: Contiguous axial images were obtained from the base of the skull through the vertex without intravenous contrast. RADIATION DOSE REDUCTION: This exam was performed according to the departmental dose-optimization program which includes automated exposure control, adjustment of the mA and/or kV according to patient size and/or use of iterative reconstruction technique. COMPARISON:  None Available. FINDINGS: Brain: No evidence of acute infarction, hemorrhage, hydrocephalus, extra-axial collection or mass lesion/mass effect. Vascular: No hyperdense vessel or unexpected calcification. Skull: Normal. Negative for fracture or focal lesion. Sinuses/Orbits: No middle ear or mastoid effusion. Paranasal sinuses are clear. Orbits are unremarkable. Other: None. IMPRESSION: No acute intracranial abnormality. Electronically Signed   By: Lorenza CambridgeHemant  Desai M.D.   On: 09/15/2022 09:31   DG Chest Portable 1 View  Result Date: 09/15/2022 CLINICAL DATA:  Altered mental status. EXAM: PORTABLE CHEST 1 VIEW COMPARISON:  None Available. FINDINGS: The cardiomediastinal silhouette is normal. Lung volumes are low. There is no definite focal consolidation. There is no pulmonary edema. There is no pleural effusion or pneumothorax There is no acute osseous abnormality. IMPRESSION: Low lung volumes. Otherwise, no radiographic evidence of acute cardiopulmonary process. Electronically Signed   By: Lesia HausenPeter  Noone M.D.   On: 09/15/2022 07:49    Pertinent labs & imaging results that were available during my care of the patient were reviewed by me and considered in my medical decision making (see MDM for  details).  Medications Ordered in ED Medications  LORazepam (ATIVAN) injection 0.5 mg (0.5 mg Intravenous Given 09/15/22 0741)  LORazepam (ATIVAN) injection 1 mg (1 mg Intravenous Given 09/15/22 0811)  iohexol (OMNIPAQUE) 300 MG/ML solution 100 mL (100 mLs Intravenous Contrast Given 09/15/22 0859)  lactated ringers bolus 1,000 mL (0 mLs Intravenous Stopped 09/15/22 1104)  fentaNYL (SUBLIMAZE) injection 50 mcg (50 mcg Intravenous Given 09/15/22 0945)                                                                                                                                     Procedures Procedures  (including critical care time)  Medical Decision Making / ED Course   This patient presents to the ED for concern of altered mental status, this involves an extensive number of treatment options, and is a complaint that carries with it a high risk of complications and morbidity.  The differential diagnosis includes infection, metabolic/toxic encephalopathy, hypoglycemia, malperfusion, hypoxia, trauma or other intracranial process MDM: Patient seen emergency room for evaluation of altered mental status.  Physical exam reveals an anxious appearing patient with some mild left lower quadrant tenderness to palpation but is otherwise unremarkable.  Laboratory evaluation unremarkable including a negative urinalysis, negative urine drug screen.  TSH is low at 0.049 but patient does not have any vital sign abnormality consistent with hyperthyroidism or thyroid storm.  CT head, chest x-ray unremarkable.  CT abdomen pelvis obtained in the setting of left lower quadrant pain and altered mental status that was also unremarkable.  Patient received anxiolysis and on reevaluation, overall symptoms improved.  At this time she does not meet inpatient criteria for admission and presentation today likely secondary to underlying dementia with anxiety and possible progression of underlying dementia.  Patient then discharged back  to facility.   Additional history obtained: -Additional history obtained from sister -External records from outside source obtained and reviewed including: Chart review including previous notes, labs, imaging, consultation notes   Lab Tests: -I ordered, reviewed, and interpreted labs.   The pertinent results include:   Labs Reviewed  COMPREHENSIVE METABOLIC PANEL - Abnormal; Notable for the following components:      Result Value   Glucose, Bld 137 (*)    All other components within normal limits  URINALYSIS, ROUTINE W REFLEX MICROSCOPIC - Abnormal; Notable for the following components:   Color, Urine STRAW (*)    Specific Gravity, Urine 1.033 (*)    All other components within normal limits  TSH - Abnormal; Notable for the following components:   TSH 0.049 (*)    All other components within normal limits  CBC WITH DIFFERENTIAL/PLATELET  RAPID URINE DRUG SCREEN, HOSP PERFORMED      EKG   EKG Interpretation  Date/Time:  Friday September 15 2022 07:23:24 EDT Ventricular Rate:  78 PR Interval:  187 QRS Duration: 162 QT Interval:  447 QTC Calculation: 510 R Axis:   -13  Text Interpretation: Sinus rhythm Right bundle branch block Confirmed by Gary Gabrielsen ([id number]) on 09/15/2022 9:03:44 PM         Imaging Studies ordered: I ordered imaging studies including CT head, CT abdomen pelvis, chest x-ray I independently visualized and interpreted imaging. I agree with the radiologist interpretation   Medicines ordered and prescription drug management: Meds ordered this encounter  Medications   LORazepam (ATIVAN) injection 0.5 mg   LORazepam (ATIVAN) injection 1 mg   iohexol (OMNIPAQUE) 300 MG/ML solution 100 mL   lactated ringers bolus 1,000 mL   fentaNYL (SUBLIMAZE) injection 50 mcg    -I have reviewed the patients home medicines and have made adjustments as needed  Critical interventions none    Cardiac Monitoring: The patient was maintained on a cardiac monitor.  I  personally viewed and interpreted the cardiac monitored which showed an underlying rhythm of: NSR  Social Determinants of Health:  Factors impacting patients care include: Lives in skilled nursing facility   Reevaluation: After the interventions noted above, I reevaluated the patient and found that they have :improved  Co morbidities that complicate the patient evaluation  Past Medical History:  Diagnosis Date   ADHD (attention deficit hyperactivity disorder)    Alzheimer disease    Hypothyroidism    Urinary retention       Dispostion: I considered admission for this patient, but at this time she does not meet inpatient criteria for admission she is safe for discharge back to facility     Final Clinical Impression(s) / ED Diagnoses Final diagnoses:  Moderate dementia with anxiety, unspecified dementia type     @PCDICTATION @    Glendora Score, MD 09/15/22 2104

## 2022-09-18 ENCOUNTER — Encounter: Payer: Self-pay | Admitting: "Endocrinology

## 2022-11-08 ENCOUNTER — Ambulatory Visit (INDEPENDENT_AMBULATORY_CARE_PROVIDER_SITE_OTHER): Payer: Medicare Other | Admitting: "Endocrinology

## 2022-11-08 ENCOUNTER — Encounter: Payer: Self-pay | Admitting: "Endocrinology

## 2022-11-08 VITALS — BP 118/86 | HR 76 | Ht 63.0 in | Wt 165.4 lb

## 2022-11-08 DIAGNOSIS — R7989 Other specified abnormal findings of blood chemistry: Secondary | ICD-10-CM | POA: Diagnosis not present

## 2022-11-08 NOTE — Progress Notes (Signed)
Endocrinology Consult Note                                            11/08/2022, 4:50 PM   Subjective:    Patient ID: Isabel Koch, female    DOB: [birth year], PCP Patient, No Pcp Per   Past Medical History:  Diagnosis Date   ADHD (attention deficit hyperactivity disorder)    Alzheimer disease (HCC)    Hypothyroidism    Urinary retention    Past Surgical History:  Procedure Laterality Date   APPENDECTOMY     PUBOVAGINAL SLING N/A 05/12/2014   Procedure: INCISION OF PUBO-VAGINAL SLING;  Surgeon: Kathi Ludwig, MD;  Location: [facility];  Service: Urology;  Laterality: N/A;   VAGINAL HYSTERECTOMY Bilateral 04/30/2014   Procedure: HYSTERECTOMY VAGINAL WITH BILATERAL SALPINGECTOMY;  Surgeon: Serita Kyle, MD;  Location: [facility];  Service: Gynecology;  Laterality: Bilateral;   VAGINAL PROLAPSE REPAIR N/A 04/30/2014   Procedure: ANTERIOR VAULT REPAIR KELLY PLICATION A CELL GRAFT  AUGMETATION ,SACROSPINUS FIXATION AND LYNX SLING ;  Surgeon: Kathi Ludwig, MD;  Location: [facility];  Service: Urology;  Laterality: N/A;   Social History   Socioeconomic History   Marital status: Divorced    Spouse name: Not on file   Number of children: 3   Years of education: HS    Highest education level: Not on file  Occupational History   Not on file  Tobacco Use   Smoking status: Never   Smokeless tobacco: Never  Vaping Use   Vaping Use: Never used  Substance and Sexual Activity   Alcohol use: Not Currently    Alcohol/week: 2.0 standard drinks of alcohol    Types: 2 Glasses of wine per week   Drug use: No   Sexual activity: Not Currently    Birth control/protection: Surgical    Comment: hyst  Other Topics Concern   Not on file  Social History Narrative   Drinks about 1 cup of coffee a day, rarely drinks coke   Social Determinants of Health   Financial Resource Strain: High Risk (07/11/2021)   Overall Financial Resource Strain (CARDIA)     Difficulty of Paying Living Expenses: Very hard  Food Insecurity: No Food Insecurity (07/11/2021)   Hunger Vital Sign    Worried About Running Out of Food in the Last Year: Never true    Ran Out of Food in the Last Year: Never true  Transportation Needs: No Transportation Needs (07/11/2021)   PRAPARE - Administrator, Civil Service (Medical): No    Lack of Transportation (Non-Medical): No  Physical Activity: Sufficiently Active (07/11/2021)   Exercise Vital Sign    Days of Exercise per Week: 4 days    Minutes of Exercise per Session: 60 min  Stress: No Stress Concern Present (07/11/2021)   [employer] of Occupational Health - Occupational Stress Questionnaire    Feeling of Stress : Not at all  Social Connections: Unknown (07/11/2021)   Social Connection and Isolation Panel [NHANES]    Frequency of Communication with Friends and Family: Twice a week    Frequency of Social Gatherings with Friends and Family: Three times a week    Attends Religious Services: Patient declined    Active Member of Clubs or Organizations: No    Attends Banker Meetings:  Never    Marital Status: Divorced   Family History  Problem Relation Age of Onset   Thyroid disease Mother    Thyroid disease Sister    Outpatient Encounter Medications as of 11/08/2022  Medication Sig   acetaminophen (TYLENOL) 500 MG tablet Take 1,000 mg by mouth every 12 (twelve) hours as needed.   chlorhexidine (PERIDEX) 0.12 % solution Use as directed 15 mLs in the mouth or throat at bedtime.   Cholecalciferol (VITAMIN D3) 50 MCG (2000 UT) TABS Take 1 tablet by mouth daily.   Dextromethorphan-guaiFENesin (ROBITUSSIN DM PO) Take by mouth as needed.   loratadine (CLARITIN) 10 MG tablet Take 10 mg by mouth daily as needed for allergies.   METAMUCIL FIBER PO Take by mouth daily.   nitroGLYCERIN (NITROSTAT) 0.4 MG SL tablet Place 0.4 mg under the tongue every 5 (five) minutes as needed for chest pain.    omeprazole (PRILOSEC) 20 MG capsule Take 20 mg by mouth daily.   Sodium Fluoride (SODIUM FLUORIDE 5000 PPM) 1.1 % PSTE Place onto teeth.   [DISCONTINUED] acetaminophen (TYLENOL) 650 MG CR tablet Take 650 mg by mouth every 4 (four) hours as needed for pain.   atorvastatin (LIPITOR) 10 MG tablet Take 10 mg by mouth daily.   calcium carbonate (TUMS - DOSED IN MG ELEMENTAL CALCIUM) 500 MG chewable tablet Chew 2 tablets by mouth 3 (three) times daily with meals.   citalopram (CELEXA) 20 MG tablet Take 30 mg by mouth daily.   donepezil (ARICEPT) 10 MG tablet Take 10 mg by mouth at bedtime.   estradiol (ESTRACE) 0.1 MG/GM vaginal cream Place 1 Applicatorful vaginally at bedtime.   famotidine (PEPCID) 20 MG tablet Take 20 mg by mouth daily.   memantine (NAMENDA) 10 MG tablet Take 10 mg by mouth 2 (two) times daily.   nystatin (MYCOSTATIN/NYSTOP) powder Apply 1 application topically 2 (two) times daily.   No facility-administered encounter medications on file as of 11/08/2022.   ALLERGIES: Allergies  Allergen Reactions   Sulfa Antibiotics Other (See Comments)    Unknown "needles in mouth" per patient     VACCINATION STATUS: Immunization History  Administered Date(s) Administered   PFIZER Comirnaty(Gray Top)Covid-19 Tri-Sucrose Vaccine 04/22/2020   PFIZER(Purple Top)SARS-COV-2 Vaccination 04/22/2020   Pfizer Covid-19 Vaccine Bivalent Booster 29yrs & up 05/11/2021    HPI Isabel Koch is 67 y.o. female who presents today with a medical history as above. she is being seen in consultation for hyperthyroidism requested by patient's nursing home provider.  She is accompanied by her caretaker from [facility] nursing and rehabilitation center in [place].  Patient is not optimal historian.  She has deep dementia.  History is obtained from chart review.  She was found to have suppressed TSH at least on 2 separate occasions.  On August 30, 2022 her TSH was 0.023 associated with total T4 of  6.1 and free T41.26. Difficult to elicit symptoms of hyperthyroidism from the patient. She denies recent major weight change, palpitations, tremors.  She denies dysphagia, shortness of breath, nor voice change. She has multiple medical problems on polypharmacy.  She is not on thyroid hormone supplements nor antithyroid medication at this time.  Review of Systems  Constitutional: Steady weight, no fatigue, no subjective hyperthermia, no subjective hypothermia Eyes: no blurry vision, no xerophthalmia ENT: no sore throat, no nodules palpated in throat, no dysphagia/odynophagia, no hoarseness Cardiovascular: no Chest Pain, no Shortness of Breath, no palpitations, no leg swelling Respiratory: no cough, no shortness of  breath Gastrointestinal: no Nausea/Vomiting/Diarhhea Musculoskeletal: no muscle/joint aches Skin: no rashes Neurological: no tremors, no numbness, no tingling, no dizziness Psychiatric: no depression, no anxiety  Objective:       11/08/2022    1:33 PM 09/15/2022   10:00 AM 09/15/2022    9:30 AM  Vitals with BMI  Height 5\' 3"     Weight 165 lbs 6 oz    BMI 29.31    Systolic 118 106 161  Diastolic 86 89 77  Pulse 76 69 70    BP 118/86   Pulse 76   Ht 5\' 3"  (1.6 m)   Wt 165 lb 6.4 oz (75 kg)   BMI 29.30 kg/m   Wt Readings from Last 3 Encounters:  11/08/22 165 lb 6.4 oz (75 kg)  09/15/22 174 lb 6.1 oz (79.1 kg)  07/11/21 176 lb (79.8 kg)    Physical Exam  Constitutional:  Body mass index is 29.3 kg/m.,  not in acute distress, normal state of mind Eyes: PERRLA, EOMI, no exophthalmos ENT: moist mucous membranes, no gross thyromegaly, no gross cervical lymphadenopathy Cardiovascular: normal precordial activity, Regular Rate and Rhythm, no Murmur/Rubs/Gallops Respiratory:  adequate breathing efforts, no gross chest deformity, Clear to auscultation bilaterally Gastrointestinal: abdomen soft, Non -tender, No distension, Bowel Sounds present, no gross  organomegaly Musculoskeletal: no gross deformities, strength intact in all four extremities, no peripheral edema Skin: moist, warm, no rashes Neurological: no tremor with outstretched hands, Deep tendon reflexes normal in bilateral lower extremities.  CMP ( most recent) CMP     Component Value Date/Time   NA 138 09/15/2022 0724   NA 140 09/06/2015 1700   K 3.9 09/15/2022 0724   CL 106 09/15/2022 0724   CO2 22 09/15/2022 0724   GLUCOSE 137 (H) 09/15/2022 0724   BUN 10 09/15/2022 0724   BUN 18 09/06/2015 1700   CREATININE 0.92 09/15/2022 0724   CALCIUM 9.1 09/15/2022 0724   PROT 8.1 09/15/2022 0724   PROT 7.0 09/06/2015 1700   ALBUMIN 3.7 09/15/2022 0724   ALBUMIN 4.2 09/06/2015 1700   AST 33 09/15/2022 0724   ALT 28 09/15/2022 0724   ALKPHOS 66 09/15/2022 0724   BILITOT 0.8 09/15/2022 0724   BILITOT 0.4 09/06/2015 1700   GFRNONAA >60 09/15/2022 0724   GFRAA 99 09/06/2015 1700       Lab Results  Component Value Date   TSH 0.049 (L) 09/15/2022   TSH 0.586 09/06/2015    August 30, 2022 labs indicated TSH was 0.023, total T46.1, free T41.26       Assessment & Plan:   1. Abnormal thyroid blood test 2.  Subclinical hypothyroidism  - Isabel Koch  is being seen at a kind request of Patient, No Pcp Per. - I have reviewed her available thyroid records and clinically evaluated the patient. - Based on these reviews, she has suppressed TSH on 2 separate occasions associated with normal T4 and free T4.  This is consistent with subclinical hyperthyroidism, however there is a possibility of secondary hypothyroidism in this patient.   -She will need further test to confirm diagnosis.  She is considered for thyroid uptake and scan before her next visit in 10 days. I have not started any new prescriptions for her today.  - she is advised to maintain close follow up with Patient, No Pcp Per for primary care needs.   - Time spent with the patient: 45 minutes, of which >50%  was spent in  counseling her about her  thyroid dysfunction and the rest in obtaining information about her symptoms, reviewing her previous labs/studies ( including abstractions from other facilities),  evaluations, and treatments,  and developing a plan to confirm diagnosis and long term treatment based on the latest standards of care/guidelines; and documenting her care.  Isabel Koch and her caretaker participated in the discussions, expressed understanding, and voiced agreement with the above plans.  All questions were answered to her satisfaction. she is encouraged to contact clinic should she have any questions or concerns prior to her return visit.  Follow up plan: Return in about 10 days (around 11/18/2022) for F/U with Thyroid Uptake and Scan.   Marquis Lunch, MD [facility] [address] [place], [place] [address] Phone: [phone]  Fax: [phone]     11/08/2022, 4:50 PM  This note was partially dictated with voice recognition software. Similar sounding words can be transcribed inadequately or may not  be corrected upon review.

## 2022-11-23 ENCOUNTER — Encounter (HOSPITAL_COMMUNITY): Payer: Self-pay

## 2022-11-23 ENCOUNTER — Ambulatory Visit (HOSPITAL_COMMUNITY)
Admission: RE | Admit: 2022-11-23 | Discharge: 2022-11-23 | Disposition: A | Discharge status: Home / Self Care | Payer: Medicare Other | Source: Ambulatory Visit | Attending: "Endocrinology | Admitting: "Endocrinology

## 2022-11-23 DIAGNOSIS — E041 Nontoxic single thyroid nodule: Secondary | ICD-10-CM | POA: Insufficient documentation

## 2022-11-23 DIAGNOSIS — R7989 Other specified abnormal findings of blood chemistry: Secondary | ICD-10-CM | POA: Insufficient documentation

## 2022-11-23 MED ORDER — SODIUM IODIDE I-123 7.4 MBQ CAPS
300.0000 | ORAL_CAPSULE | Freq: Once | ORAL | Status: AC
  Administered 2022-11-23: 314 via ORAL

## 2022-11-24 ENCOUNTER — Encounter (HOSPITAL_COMMUNITY)
Admission: RE | Admit: 2022-11-24 | Discharge: 2022-11-24 | Disposition: A | Discharge status: Home / Self Care | Payer: Medicare Other | Source: Ambulatory Visit | Attending: "Endocrinology | Admitting: "Endocrinology

## 2022-11-28 ENCOUNTER — Encounter: Payer: Self-pay | Admitting: "Endocrinology

## 2022-11-28 ENCOUNTER — Ambulatory Visit (INDEPENDENT_AMBULATORY_CARE_PROVIDER_SITE_OTHER): Payer: Medicare Other | Admitting: "Endocrinology

## 2022-11-28 VITALS — BP 112/86 | HR 80 | Ht 63.0 in | Wt 158.4 lb

## 2022-11-28 DIAGNOSIS — E0510 Thyrotoxicosis with toxic single thyroid nodule without thyrotoxic crisis or storm: Secondary | ICD-10-CM

## 2022-11-28 DIAGNOSIS — E0590 Thyrotoxicosis, unspecified without thyrotoxic crisis or storm: Secondary | ICD-10-CM | POA: Insufficient documentation

## 2022-11-28 MED ORDER — METHIMAZOLE 5 MG PO TABS
5.0000 mg | ORAL_TABLET | Freq: Every day | ORAL | 3 refills | Status: DC
Start: 2022-11-28 — End: 2023-03-06

## 2022-11-28 NOTE — Progress Notes (Signed)
11/28/2022, 2:34 PM  Endocrinology follow-up note   Subjective:    Patient ID: Isabel Koch, female    DOB: 12/13/1955, PCP Patient, No Pcp Per   Past Medical History:  Diagnosis Date   ADHD (attention deficit hyperactivity disorder)    Alzheimer disease (HCC)    Hypothyroidism    Urinary retention    Past Surgical History:  Procedure Laterality Date   APPENDECTOMY     PUBOVAGINAL SLING N/A 05/12/2014   Procedure: INCISION OF PUBO-VAGINAL SLING;  Surgeon: Kathi Ludwig, MD;  Location: [facility];  Service: Urology;  Laterality: N/A;   VAGINAL HYSTERECTOMY Bilateral 04/30/2014   Procedure: HYSTERECTOMY VAGINAL WITH BILATERAL SALPINGECTOMY;  Surgeon: Serita Kyle, MD;  Location: [facility];  Service: Gynecology;  Laterality: Bilateral;   VAGINAL PROLAPSE REPAIR N/A 04/30/2014   Procedure: ANTERIOR VAULT REPAIR KELLY PLICATION A CELL GRAFT  AUGMETATION ,SACROSPINUS FIXATION AND LYNX SLING ;  Surgeon: Kathi Ludwig, MD;  Location: [facility];  Service: Urology;  Laterality: N/A;   Social History   Socioeconomic History   Marital status: Divorced    Spouse name: Not on file   Number of children: 3   Years of education: HS    Highest education level: Not on file  Occupational History   Not on file  Tobacco Use   Smoking status: Never   Smokeless tobacco: Never  Vaping Use   Vaping Use: Never used  Substance and Sexual Activity   Alcohol use: Not Currently    Alcohol/week: 2.0 standard drinks of alcohol    Types: 2 Glasses of wine per week   Drug use: No   Sexual activity: Not Currently    Birth control/protection: Surgical    Comment: hyst  Other Topics Concern   Not on file  Social History Narrative   Drinks about 1 cup of coffee a day, rarely drinks coke   Social Determinants of Health   Financial Resource Strain: High Risk (07/11/2021)   Overall Financial Resource Strain  (CARDIA)    Difficulty of Paying Living Expenses: Very hard  Food Insecurity: No Food Insecurity (07/11/2021)   Hunger Vital Sign    Worried About Running Out of Food in the Last Year: Never true    Ran Out of Food in the Last Year: Never true  Transportation Needs: No Transportation Needs (07/11/2021)   PRAPARE - Administrator, Civil Service (Medical): No    Lack of Transportation (Non-Medical): No  Physical Activity: Sufficiently Active (07/11/2021)   Exercise Vital Sign    Days of Exercise per Week: 4 days    Minutes of Exercise per Session: 60 min  Stress: No Stress Concern Present (07/11/2021)   [employer] of Occupational Health - Occupational Stress Questionnaire    Feeling of Stress : Not at all  Social Connections: Unknown (07/11/2021)   Social Connection and Isolation Panel [NHANES]    Frequency of Communication with Friends and Family: Twice a week    Frequency of Social Gatherings with Friends and Family: Three times a week    Attends Religious Services: Patient declined    Active Member of Clubs or Organizations: No    Attends Club or  Organization Meetings: Never    Marital Status: Divorced   Family History  Problem Relation Age of Onset   Thyroid disease Mother    Thyroid disease Sister    Outpatient Encounter Medications as of 11/28/2022  Medication Sig   methimazole (TAPAZOLE) 5 MG tablet Take 1 tablet (5 mg total) by mouth daily.   acetaminophen (TYLENOL) 500 MG tablet Take 1,000 mg by mouth every 12 (twelve) hours as needed.   atorvastatin (LIPITOR) 10 MG tablet Take 10 mg by mouth daily.   calcium carbonate (TUMS - DOSED IN MG ELEMENTAL CALCIUM) 500 MG chewable tablet Chew 2 tablets by mouth 3 (three) times daily with meals.   chlorhexidine (PERIDEX) 0.12 % solution Use as directed 15 mLs in the mouth or throat at bedtime.   Cholecalciferol (VITAMIN D3) 50 MCG (2000 UT) TABS Take 1 tablet by mouth daily.   citalopram (CELEXA) 20 MG tablet Take 30  mg by mouth daily.   Dextromethorphan-guaiFENesin (ROBITUSSIN DM PO) Take by mouth as needed.   donepezil (ARICEPT) 10 MG tablet Take 10 mg by mouth at bedtime.   estradiol (ESTRACE) 0.1 MG/GM vaginal cream Place 1 Applicatorful vaginally at bedtime.   famotidine (PEPCID) 20 MG tablet Take 20 mg by mouth daily.   loratadine (CLARITIN) 10 MG tablet Take 10 mg by mouth daily as needed for allergies.   memantine (NAMENDA) 10 MG tablet Take 10 mg by mouth 2 (two) times daily.   METAMUCIL FIBER PO Take by mouth daily.   nitroGLYCERIN (NITROSTAT) 0.4 MG SL tablet Place 0.4 mg under the tongue every 5 (five) minutes as needed for chest pain.   nystatin (MYCOSTATIN/NYSTOP) powder Apply 1 application topically 2 (two) times daily.   omeprazole (PRILOSEC) 20 MG capsule Take 20 mg by mouth daily.   Sodium Fluoride (SODIUM FLUORIDE 5000 PPM) 1.1 % PSTE Place onto teeth.   No facility-administered encounter medications on file as of 11/28/2022.   ALLERGIES: Allergies  Allergen Reactions   Sulfa Antibiotics Other (See Comments)    Unknown "needles in mouth" per patient     VACCINATION STATUS: Immunization History  Administered Date(s) Administered   PFIZER Comirnaty(Gray Top)Covid-19 Tri-Sucrose Vaccine 04/22/2020   PFIZER(Purple Top)SARS-COV-2 Vaccination 04/22/2020   Pfizer Covid-19 Vaccine Bivalent Booster 71yrs & up 05/11/2021    HPI JANEEKA Koch is 67 y.o. female who presents today with a medical history as above. she is being seen in follow-up after she was seen in consultation for hyperthyroidism requested by patient's nursing home provider.  She is accompanied by her father and her caretaker from [facility] nursing and rehabilitation center in [place].  Patient is not optimal historian.  She has deep dementia.   After her last visit, she was sent for thyroid uptake and scan for better workup.  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  She presents with more weight loss.  She was found to have suppressed TSH at least on 2 separate occasions.  On August 30, 2022 her TSH was 0.023 associated with total T4 of 6.1 and free T4  1.26. Difficult to elicit symptoms of hyperthyroidism from the patient. She denies recent major weight change, palpitations, tremors.  She denies dysphagia, shortness of breath, nor voice change. She has multiple medical problems on polypharmacy.  She is not on thyroid hormone supplements nor antithyroid medication at this time.  Review of Systems  Constitutional: + Unintended weight loss of 7 pounds since last  visit     Objective:       11/28/2022    1:41 PM 11/08/2022    1:33 PM 09/15/2022   10:00 AM  Vitals with BMI  Height 5\' 3"  5\' 3"    Weight 158 lbs 6 oz 165 lbs 6 oz   BMI 28.07 29.31   Systolic 112 118 161  Diastolic 86 86 89  Pulse 80 76 69    BP 112/86   Pulse 80   Ht 5\' 3"  (1.6 m)   Wt 158 lb 6.4 oz (71.8 kg)   BMI 28.06 kg/m   Wt Readings from Last 3 Encounters:  11/28/22 158 lb 6.4 oz (71.8 kg)  11/08/22 165 lb 6.4 oz (75 kg)  09/15/22 174 lb 6.1 oz (79.1 kg)    Physical Exam  Constitutional:  Body mass index is 28.06 kg/m.,  + Evident cognitive deficit.    ENT: moist mucous membranes, no gross thyromegaly, no gross cervical lymphadenopathy Cardiovascular: normal precordial activity, Regular Rate and Rhythm, no Murmur/Rubs/Gallops   CMP ( most recent) CMP     Component Value Date/Time   NA 138 09/15/2022 0724   NA 140 09/06/2015 1700   K 3.9 09/15/2022 0724   CL 106 09/15/2022 0724   CO2 22 09/15/2022 0724   GLUCOSE 137 (H) 09/15/2022 0724   BUN 10 09/15/2022 0724   BUN 18 09/06/2015 1700   CREATININE 0.92 09/15/2022 0724   CALCIUM 9.1 09/15/2022 0724   PROT 8.1 09/15/2022 0724   PROT 7.0 09/06/2015 1700   ALBUMIN 3.7 09/15/2022 0724   ALBUMIN 4.2 09/06/2015 1700   AST 33 09/15/2022 0724   ALT 28 09/15/2022 0724   ALKPHOS 66 09/15/2022 0724    BILITOT 0.8 09/15/2022 0724   BILITOT 0.4 09/06/2015 1700   GFRNONAA >60 09/15/2022 0724   GFRAA 99 09/06/2015 1700       Lab Results  Component Value Date   TSH 0.049 (L) 09/15/2022   TSH 0.586 09/06/2015    August 30, 2022 labs indicated TSH was 0.023, total T46.1, free T41.26      Thyroid uptake and scan on November 24, 2022 FINDINGS: There is a hot nodule in the right side of the isthmus or medial aspect of the right thyroid lobe. There is suppressed uptake throughout the remainder of the thyroid.   4 hour I-123 uptake = 6.1% (normal 5-20%)   24 hour I-123 uptake = 15.2% (normal 10-30%)   IMPRESSION: There is a hot nodule in either the right side of the isthmus with the medial aspect of the right thyroid lobe with suppression of uptake throughout the remainder of the thyroid. The findings are consistent with a hyperfunctioning thyroid nodule.  Assessment & Plan:   1.  Toxic thyroid nodule 2.  Unexplained weight loss  - I have reviewed her new and available thyroid records and clinically evaluated the patient. - Based on these reviews, she has suppressed TSH on 2 separate occasions associated with normal T4 and free T4.   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  She would benefit from antithyroid intervention.  However, she will not need ablative treatment.  I discussed and initiated methimazole 5 mg p.o. daily at breakfast with plan to repeat thyroid function test in 3 months.   I spent  21  minutes in the care of the patient today including review of labs from Thyroid Function, CMP, and other relevant labs ; imaging/biopsy records (current and previous  including abstractions from other facilities); face-to-face time discussing  her lab results and symptoms, medications doses, her options of short and long term treatment based on the latest standards of care / guidelines;   and documenting the encounter.  Isabel Koch  participated  in the discussions, expressed understanding, and voiced agreement with the above plans.  All questions were answered to her satisfaction. she is encouraged to contact clinic should she have any questions or concerns prior to her return visit.  Follow up plan: Return in about 3 months (around 02/28/2023) for F/U with Pre-visit Labs.   Marquis Lunch, MD [facility] [address] [place], [place] [address] Phone: [phone]  Fax: [phone]     11/28/2022, 2:34 PM  This note was partially dictated with voice recognition software. Similar sounding words can be transcribed inadequately or may not  be corrected upon review.

## 2023-03-02 LAB — TSH: TSH: 1.02 (ref 0.41–5.90)

## 2023-03-06 ENCOUNTER — Ambulatory Visit (INDEPENDENT_AMBULATORY_CARE_PROVIDER_SITE_OTHER): Payer: Medicare Other | Admitting: "Endocrinology

## 2023-03-06 ENCOUNTER — Encounter: Payer: Self-pay | Admitting: "Endocrinology

## 2023-03-06 VITALS — BP 118/84 | HR 76 | Ht 63.0 in | Wt 148.8 lb

## 2023-03-06 DIAGNOSIS — E0590 Thyrotoxicosis, unspecified without thyrotoxic crisis or storm: Secondary | ICD-10-CM

## 2023-03-06 MED ORDER — METHIMAZOLE 5 MG PO TABS: 2.5000 mg | ORAL_TABLET | Freq: Every day | ORAL | 1 refills | Status: DC

## 2023-03-06 NOTE — Progress Notes (Signed)
03/06/2023, 5:42 PM  Endocrinology follow-up note   Subjective:    Patient ID: Isabel Koch, female    DOB: 02-Mar-1956, PCP Patient, No Pcp Per   Past Medical History:  Diagnosis Date   ADHD (attention deficit hyperactivity disorder)    Alzheimer disease (HCC)    Hypothyroidism    Urinary retention    Past Surgical History:  Procedure Laterality Date   APPENDECTOMY     PUBOVAGINAL SLING N/A 05/12/2014   Procedure: INCISION OF PUBO-VAGINAL SLING;  Surgeon: Kathi Ludwig, MD;  Location: [facility];  Service: Urology;  Laterality: N/A;   VAGINAL HYSTERECTOMY Bilateral 04/30/2014   Procedure: HYSTERECTOMY VAGINAL WITH BILATERAL SALPINGECTOMY;  Surgeon: Serita Kyle, MD;  Location: [facility];  Service: Gynecology;  Laterality: Bilateral;   VAGINAL PROLAPSE REPAIR N/A 04/30/2014   Procedure: ANTERIOR VAULT REPAIR KELLY PLICATION A CELL GRAFT  AUGMETATION ,SACROSPINUS FIXATION AND LYNX SLING ;  Surgeon: Kathi Ludwig, MD;  Location: [facility];  Service: Urology;  Laterality: N/A;   Social History   Socioeconomic History   Marital status: Divorced    Spouse name: Not on file   Number of children: 3   Years of education: HS    Highest education level: Not on file  Occupational History   Not on file  Tobacco Use   Smoking status: Never   Smokeless tobacco: Never  Vaping Use   Vaping status: Never Used  Substance and Sexual Activity   Alcohol use: Not Currently    Alcohol/week: 2.0 standard drinks of alcohol    Types: 2 Glasses of wine per week   Drug use: No   Sexual activity: Not Currently    Birth control/protection: Surgical    Comment: hyst  Other Topics Concern   Not on file  Social History Narrative   Drinks about 1 cup of coffee a day, rarely drinks coke   Social Determinants of Health   Financial Resource Strain: High Risk (07/11/2021)   Overall Financial Resource Strain  (CARDIA)    Difficulty of Paying Living Expenses: Very hard  Food Insecurity: No Food Insecurity (07/11/2021)   Hunger Vital Sign    Worried About Running Out of Food in the Last Year: Never true    Ran Out of Food in the Last Year: Never true  Transportation Needs: No Transportation Needs (07/11/2021)   PRAPARE - Administrator, Civil Service (Medical): No    Lack of Transportation (Non-Medical): No  Physical Activity: Sufficiently Active (07/11/2021)   Exercise Vital Sign    Days of Exercise per Week: 4 days    Minutes of Exercise per Session: 60 min  Stress: No Stress Concern Present (07/11/2021)   [employer] of Occupational Health - Occupational Stress Questionnaire    Feeling of Stress : Not at all  Social Connections: Unknown (10/15/2021)   Received from [facility]   Social Network    Social Network: Not on file   Family History  Problem Relation Age of Onset   Thyroid disease Mother    Thyroid disease Sister    Outpatient Encounter Medications as of 03/06/2023  Medication Sig   acetaminophen (TYLENOL) 500 MG tablet  Take 1,000 mg by mouth every 12 (twelve) hours as needed.   atorvastatin (LIPITOR) 10 MG tablet Take 10 mg by mouth daily.   calcium carbonate (TUMS - DOSED IN MG ELEMENTAL CALCIUM) 500 MG chewable tablet Chew 2 tablets by mouth 3 (three) times daily with meals.   chlorhexidine (PERIDEX) 0.12 % solution Use as directed 15 mLs in the mouth or throat at bedtime.   Cholecalciferol (VITAMIN D3) 50 MCG (2000 UT) TABS Take 1 tablet by mouth daily.   citalopram (CELEXA) 20 MG tablet Take 30 mg by mouth daily.   Dextromethorphan-guaiFENesin (ROBITUSSIN DM PO) Take by mouth as needed.   donepezil (ARICEPT) 10 MG tablet Take 10 mg by mouth at bedtime.   estradiol (ESTRACE) 0.1 MG/GM vaginal cream Place 1 Applicatorful vaginally at bedtime.   famotidine (PEPCID) 20 MG tablet Take 20 mg by mouth daily.   loratadine (CLARITIN) 10 MG tablet Take  10 mg by mouth daily as needed for allergies.   memantine (NAMENDA) 10 MG tablet Take 10 mg by mouth 2 (two) times daily.   METAMUCIL FIBER PO Take by mouth daily.   methimazole (TAPAZOLE) 5 MG tablet Take 0.5 tablets (2.5 mg total) by mouth daily with breakfast.   nitroGLYCERIN (NITROSTAT) 0.4 MG SL tablet Place 0.4 mg under the tongue every 5 (five) minutes as needed for chest pain.   nystatin (MYCOSTATIN/NYSTOP) powder Apply 1 application topically 2 (two) times daily.   omeprazole (PRILOSEC) 20 MG capsule Take 20 mg by mouth daily.   Sodium Fluoride (SODIUM FLUORIDE 5000 PPM) 1.1 % PSTE Place onto teeth.   [DISCONTINUED] methimazole (TAPAZOLE) 5 MG tablet Take 1 tablet (5 mg total) by mouth daily.   No facility-administered encounter medications on file as of 03/06/2023.   ALLERGIES: Allergies  Allergen Reactions   Sulfa Antibiotics Other (See Comments)    Unknown "needles in mouth" per patient     VACCINATION STATUS: Immunization History  Administered Date(s) Administered   PFIZER(Purple Top)SARS-COV-2 Vaccination 04/22/2020   Pfizer Covid-19 Vaccine Bivalent Booster 74yrs & up 05/11/2021    HPI Isabel Koch is 67 y.o. female who presents today with a medical history as above. she is being seen in follow-up after she was seen in consultation for hyperthyroidism requested by patient's nursing home provider.  She is accompanied by her daughter and her caretaker from [facility] in [place].  Patient is not optimal historian.  She has deep dementia.   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  She was given an option of low-dose methimazole to treat mild hyperthyroidism.  She presents with thyroid function test consistent with treatment effect.  She continues to have weight loss.  He does not have new complaints today.  Difficult to elicit symptoms of  hyperthyroidism from the patient. She denies  palpitations, tremors.  She denies dysphagia, shortness of breath, nor voice change. She has multiple medical problems on polypharmacy.  She is not on thyroid hormone supplements nor antithyroid medication at this time.  Review of Systems  Constitutional: + Unintended weight loss of 7 pounds since last visit     Objective:       03/06/2023    1:13 PM 11/28/2022    1:41 PM 11/08/2022    1:33 PM  Vitals with BMI  Height 5\' 3"  5\' 3"  5\' 3"   Weight 148 lbs 13 oz 158 lbs 6 oz  165 lbs 6 oz  BMI 26.37 28.07 29.31  Systolic 118 112 098  Diastolic 84 86 86  Pulse 76 80 76    BP 118/84   Pulse 76   Ht 5\' 3"  (1.6 m)   Wt 148 lb 12.8 oz (67.5 kg)   BMI 26.36 kg/m   Wt Readings from Last 3 Encounters:  03/06/23 148 lb 12.8 oz (67.5 kg)  11/28/22 158 lb 6.4 oz (71.8 kg)  11/08/22 165 lb 6.4 oz (75 kg)    Physical Exam  Constitutional:  Body mass index is 26.36 kg/m.,  + Evident cognitive deficit.    ENT: moist mucous membranes, no gross thyromegaly, no gross cervical lymphadenopathy Cardiovascular: normal precordial activity, Regular Rate and Rhythm, no Murmur/Rubs/Gallops   CMP ( most recent) CMP     Component Value Date/Time   NA 138 09/15/2022 0724   NA 140 09/06/2015 1700   K 3.9 09/15/2022 0724   CL 106 09/15/2022 0724   CO2 22 09/15/2022 0724   GLUCOSE 137 (H) 09/15/2022 0724   BUN 10 09/15/2022 0724   BUN 18 09/06/2015 1700   CREATININE 0.92 09/15/2022 0724   CALCIUM 9.1 09/15/2022 0724   PROT 8.1 09/15/2022 0724   PROT 7.0 09/06/2015 1700   ALBUMIN 3.7 09/15/2022 0724   ALBUMIN 4.2 09/06/2015 1700   AST 33 09/15/2022 0724   ALT 28 09/15/2022 0724   ALKPHOS 66 09/15/2022 0724   BILITOT 0.8 09/15/2022 0724   BILITOT 0.4 09/06/2015 1700   GFRNONAA >60 09/15/2022 0724   GFRAA 99 09/06/2015 1700       Lab Results  Component Value Date   TSH 1.02 03/02/2023   TSH 0.049 (L) 09/15/2022   TSH 0.586 09/06/2015     August 30, 2022 labs indicated TSH was 0.023, total T46.1, free T41.26      Thyroid uptake and scan on November 24, 2022 FINDINGS: There is a hot nodule in the right side of the isthmus or medial aspect of the right thyroid lobe. There is suppressed uptake throughout the remainder of the thyroid.   4 hour I-123 uptake = 6.1% (normal 5-20%)   24 hour I-123 uptake = 15.2% (normal 10-30%)   IMPRESSION: There is a hot nodule in either the right side of the isthmus with the medial aspect of the right thyroid lobe with suppression of uptake throughout the remainder of the thyroid. The findings are consistent with a hyperfunctioning thyroid nodule.  Assessment & Plan:   1.  Toxic thyroid nodule 2.  Unexplained weight loss  - I have reviewed her new and available thyroid records and clinically evaluated the patient. - Based on these reviews, she has responding to low-dose methimazole.  I discussed and lowered her methimazole to 2.5 mg p.o. daily at breakfast.  She will have repeat thyroid function test before her next visit.      Her previous thyroid uptake and scan confirmed the presence of toxic thyroid adenoma.  She did not need antithyroid ablative radioactive iodine treatment.     I spent  21  minutes in the care of the patient today including review of labs from Thyroid Function, CMP, and other relevant labs ; imaging/biopsy records (current and previous including abstractions from other facilities); face-to-face time discussing  her lab results and symptoms, medications doses, her options of short and long term treatment based on the latest standards of care / guidelines;   and documenting the encounter.  Isabel Koch  participated in  the discussions, expressed understanding, and voiced agreement with the above plans.  All questions were answered to her satisfaction. she is encouraged to contact clinic should she have any questions or concerns prior to her return  visit.   Follow up plan: Return in about 4 months (around 07/06/2023) for F/U with Pre-visit Labs.   Marquis Lunch, MD [facility] [address] [place], [place] [address] Phone: [phone]  Fax: [phone]     03/06/2023, 5:42 PM  This note was partially dictated with voice recognition software. Similar sounding words can be transcribed inadequately or may not  be corrected upon review.

## 2023-04-28 ENCOUNTER — Encounter (HOSPITAL_COMMUNITY): Payer: Self-pay

## 2023-04-28 ENCOUNTER — Emergency Department (HOSPITAL_COMMUNITY): Payer: Medicare Other

## 2023-04-28 ENCOUNTER — Emergency Department (HOSPITAL_COMMUNITY)
Admission: EM | Admit: 2023-04-28 | Discharge: 2023-04-28 | Disposition: A | Discharge status: Home / Self Care | Payer: Medicare Other | Attending: Emergency Medicine | Admitting: Emergency Medicine

## 2023-04-28 DIAGNOSIS — M4802 Spinal stenosis, cervical region: Secondary | ICD-10-CM | POA: Insufficient documentation

## 2023-04-28 DIAGNOSIS — M50322 Other cervical disc degeneration at C5-C6 level: Secondary | ICD-10-CM | POA: Insufficient documentation

## 2023-04-28 DIAGNOSIS — E039 Hypothyroidism, unspecified: Secondary | ICD-10-CM | POA: Diagnosis not present

## 2023-04-28 DIAGNOSIS — K623 Rectal prolapse: Secondary | ICD-10-CM | POA: Insufficient documentation

## 2023-04-28 DIAGNOSIS — M2500 Hemarthrosis, unspecified joint: Secondary | ICD-10-CM | POA: Diagnosis not present

## 2023-04-28 DIAGNOSIS — J449 Chronic obstructive pulmonary disease, unspecified: Secondary | ICD-10-CM | POA: Insufficient documentation

## 2023-04-28 DIAGNOSIS — M4804 Spinal stenosis, thoracic region: Secondary | ICD-10-CM | POA: Diagnosis not present

## 2023-04-28 DIAGNOSIS — M5033 Other cervical disc degeneration, cervicothoracic region: Secondary | ICD-10-CM | POA: Diagnosis not present

## 2023-04-28 DIAGNOSIS — S43034A Inferior dislocation of right humerus, initial encounter: Secondary | ICD-10-CM | POA: Diagnosis not present

## 2023-04-28 DIAGNOSIS — M8588 Other specified disorders of bone density and structure, other site: Secondary | ICD-10-CM | POA: Insufficient documentation

## 2023-04-28 DIAGNOSIS — F0390 Unspecified dementia without behavioral disturbance: Secondary | ICD-10-CM | POA: Diagnosis not present

## 2023-04-28 DIAGNOSIS — S4291XA Fracture of right shoulder girdle, part unspecified, initial encounter for closed fracture: Secondary | ICD-10-CM | POA: Diagnosis not present

## 2023-04-28 DIAGNOSIS — M50321 Other cervical disc degeneration at C4-C5 level: Secondary | ICD-10-CM | POA: Insufficient documentation

## 2023-04-28 DIAGNOSIS — M50323 Other cervical disc degeneration at C6-C7 level: Secondary | ICD-10-CM | POA: Diagnosis not present

## 2023-04-28 DIAGNOSIS — I700 Atherosclerosis of aorta: Secondary | ICD-10-CM | POA: Insufficient documentation

## 2023-04-28 DIAGNOSIS — N8110 Cystocele, unspecified: Secondary | ICD-10-CM | POA: Diagnosis not present

## 2023-04-28 DIAGNOSIS — W1830XA Fall on same level, unspecified, initial encounter: Secondary | ICD-10-CM | POA: Diagnosis not present

## 2023-04-28 DIAGNOSIS — M5134 Other intervertebral disc degeneration, thoracic region: Secondary | ICD-10-CM | POA: Diagnosis not present

## 2023-04-28 DIAGNOSIS — M2578 Osteophyte, vertebrae: Secondary | ICD-10-CM | POA: Insufficient documentation

## 2023-04-28 DIAGNOSIS — K5900 Constipation, unspecified: Secondary | ICD-10-CM | POA: Diagnosis not present

## 2023-04-28 DIAGNOSIS — M47812 Spondylosis without myelopathy or radiculopathy, cervical region: Secondary | ICD-10-CM | POA: Diagnosis not present

## 2023-04-28 DIAGNOSIS — M4854XA Collapsed vertebra, not elsewhere classified, thoracic region, initial encounter for fracture: Secondary | ICD-10-CM | POA: Diagnosis not present

## 2023-04-28 DIAGNOSIS — M419 Scoliosis, unspecified: Secondary | ICD-10-CM | POA: Insufficient documentation

## 2023-04-28 DIAGNOSIS — Q2546 Tortuous aortic arch: Secondary | ICD-10-CM | POA: Diagnosis not present

## 2023-04-28 DIAGNOSIS — S43004A Unspecified dislocation of right shoulder joint, initial encounter: Secondary | ICD-10-CM | POA: Insufficient documentation

## 2023-04-28 DIAGNOSIS — K5730 Diverticulosis of large intestine without perforation or abscess without bleeding: Secondary | ICD-10-CM | POA: Insufficient documentation

## 2023-04-28 DIAGNOSIS — W19XXXA Unspecified fall, initial encounter: Secondary | ICD-10-CM

## 2023-04-28 LAB — CBC WITH DIFFERENTIAL/PLATELET
Abs Immature Granulocytes: 0.02 10*3/uL (ref 0.00–0.07)
Basophils Absolute: 0 10*3/uL (ref 0.0–0.1)
Basophils Relative: 0 %
Eosinophils Absolute: 0.1 10*3/uL (ref 0.0–0.5)
Eosinophils Relative: 2 %
HCT: 36.4 % (ref 36.0–46.0)
Hemoglobin: 12.2 g/dL (ref 12.0–15.0)
Immature Granulocytes: 0 %
Lymphocytes Relative: 41 %
Lymphs Abs: 2.1 10*3/uL (ref 0.7–4.0)
MCH: 31 pg (ref 26.0–34.0)
MCHC: 33.5 g/dL (ref 30.0–36.0)
MCV: 92.6 fL (ref 80.0–100.0)
Monocytes Absolute: 0.4 10*3/uL (ref 0.1–1.0)
Monocytes Relative: 7 %
Neutro Abs: 2.5 10*3/uL (ref 1.7–7.7)
Neutrophils Relative %: 50 %
Platelets: 201 10*3/uL (ref 150–400)
RBC: 3.93 MIL/uL (ref 3.87–5.11)
RDW: 14 % (ref 11.5–15.5)
WBC: 5.2 10*3/uL (ref 4.0–10.5)
nRBC: 0 % (ref 0.0–0.2)

## 2023-04-28 LAB — BASIC METABOLIC PANEL
Anion gap: 11 (ref 5–15)
BUN: 25 mg/dL — ABNORMAL HIGH (ref 8–23)
CO2: 19 mmol/L — ABNORMAL LOW (ref 22–32)
Calcium: 8.6 mg/dL — ABNORMAL LOW (ref 8.9–10.3)
Chloride: 110 mmol/L (ref 98–111)
Creatinine, Ser: 0.91 mg/dL (ref 0.44–1.00)
GFR, Estimated: >60 mL/min (ref >60)
Glucose, Bld: 161 mg/dL — ABNORMAL HIGH (ref 70–99)
Potassium: 3.8 mmol/L (ref 3.5–5.1)
Sodium: 140 mmol/L (ref 135–145)

## 2023-04-28 MED ORDER — LORAZEPAM 2 MG/ML IJ SOLN
1.0000 mg | Freq: Once | INTRAMUSCULAR | Status: AC
  Administered 2023-04-28: 1 mg via INTRAVENOUS

## 2023-04-28 MED ORDER — PROPOFOL 10 MG/ML IV BOLUS
50.0000 mg | Freq: Once | INTRAVENOUS | Status: AC
  Administered 2023-04-28: 50 mg via INTRAVENOUS
  Filled 2023-04-28: qty 20

## 2023-04-28 MED ORDER — FENTANYL CITRATE PF 50 MCG/ML IJ SOSY
25.0000 ug | PREFILLED_SYRINGE | Freq: Once | INTRAMUSCULAR | Status: AC
  Administered 2023-04-28: 25 ug via INTRAVENOUS
  Filled 2023-04-28: qty 1

## 2023-04-28 MED ORDER — LORAZEPAM 2 MG/ML IJ SOLN
INTRAMUSCULAR | Status: AC
  Filled 2023-04-28: qty 1

## 2023-04-28 MED ORDER — STERILE WATER FOR INJECTION IJ SOLN
INTRAMUSCULAR | Status: AC
  Administered 2023-04-28: 10 mL
  Filled 2023-04-28: qty 10

## 2023-04-28 MED ORDER — FENTANYL CITRATE PF 50 MCG/ML IJ SOSY
25.0000 ug | PREFILLED_SYRINGE | Freq: Once | INTRAMUSCULAR | Status: AC
Start: 2023-04-28 — End: 2023-04-28
  Administered 2023-04-28: 25 ug via INTRAVENOUS
  Filled 2023-04-28: qty 1

## 2023-04-28 MED ORDER — IOHEXOL 300 MG/ML  SOLN
80.0000 mL | Freq: Once | INTRAMUSCULAR | Status: AC | PRN
  Administered 2023-04-28: 80 mL via INTRAVENOUS

## 2023-04-28 MED ORDER — ZIPRASIDONE MESYLATE 20 MG IM SOLR
10.0000 mg | Freq: Once | INTRAMUSCULAR | Status: AC
  Administered 2023-04-28: 10 mg via INTRAMUSCULAR
  Filled 2023-04-28: qty 20

## 2023-04-28 NOTE — ED Provider Notes (Signed)
9:57 AM Care of the patient had been assumed at signout.  On my initial evaluation the patient was accompanied by her sister.  Sister not power of attorney, but consented for ongoing care of the patient.  I reviewed patient CT imaging, discussed with radiology, and reviewed x-ray imaging.  Radiology findings most concerning for right shoulder fracture dislocation.  After discussion with the patient's sister, attempts were made for reduction without additional sedation as the patient had received Geodon, Ativan prior to my evaluation.  After fentanyl bolus patient was not amenable to reduction attempts.  Patient required conscious sedation, consent by sister.  Now, following conscious sedation, reduction, the shoulder is substantially more aligned, fracture fragments noted.  Case discussed with orthopedics and Dr. Romeo Apple will see the patient in follow-up.  Other findings considered, discussed with family, generally reassuring or consistent with known processes.  Patient will return to her nursing facility.  [facility] Injury Treatment  Date/Time: 04/28/2023 9:05 AM  Performed by: Gerhard Munch, MD Authorized by: Gerhard Munch, MD   Consent:    Consent obtained:  Written   Consent given by:  Healthcare agent (sister)   Risks discussed:  Irreducible dislocation and fracture   Alternatives discussed:  Alternative treatment Universal protocol:    Procedure explained and questions answered to patient or proxy's satisfaction: yes     Imaging studies available: yes     Required blood products, implants, devices, and special equipment available: yes     Site/side marked: yes     Immediately prior to procedure a time out was called: yes     Patient identity confirmed:  Arm bandInjury location: shoulder Location details: right shoulder Injury type: fracture-dislocation Dislocation type: anterior Fracture type: greater humeral tuberosity Pre-procedure neurovascular assessment: neurovascularly  intact Pre-procedure distal perfusion: normal Pre-procedure neurological function: normal Pre-procedure range of motion: reduced  Anesthesia: Local anesthesia used: no  Patient sedated: Yes. Refer to sedation procedure documentation for details of sedation. Immobilization: sling Splint Applied by: ED Nurse and ED Provider Post-procedure neurovascular assessment: post-procedure neurovascularly intact Post-procedure distal perfusion: normal Post-procedure neurological function: normal Post-procedure range of motion: improved   .Sedation  Date/Time: 04/28/2023 9:10 AM  Performed by: Gerhard Munch, MD Authorized by: Gerhard Munch, MD   Consent:    Consent obtained:  Written   Consent given by:  Healthcare agent   Risks discussed:  Prolonged sedation necessitating reversal, prolonged hypoxia resulting in organ damage and respiratory compromise necessitating ventilatory assistance and intubation   Alternatives discussed:  Analgesia without sedation Universal protocol:    Procedure explained and questions answered to patient or proxy's satisfaction: yes     Imaging studies available: yes     Required blood products, implants, devices, and special equipment available: yes     Site/side marked: yes     Immediately prior to procedure, a time out was called: yes     Patient identity confirmed:  Arm band Indications:    Procedure performed:  Dislocation reduction   Procedure necessitating sedation performed by:  Physician performing sedation Pre-sedation assessment:    Time since last food or drink:  6   NPO status caution: unable to specify NPO status     ASA classification: class 3 - patient with severe systemic disease     Mouth opening:  2 finger widths   Thyromental distance:  2 finger widths   Mallampati score:  II - soft palate, uvula, fauces visible   Neck mobility: reduced     Pre-sedation assessments completed  and reviewed: airway patency, cardiovascular function,  mental status and nausea/vomiting     Pre-sedation assessment completed:  04/28/2023 9:10 AM Immediate pre-procedure details:    Reassessment: Patient reassessed immediately prior to procedure     Reviewed: vital signs     Verified: bag valve mask available, emergency equipment available, intubation equipment available, IV patency confirmed, oxygen available and suction available   Procedure details (see MAR for exact dosages):    Preoxygenation:  Nasal cannula   Sedation:  Propofol   Intended level of sedation: deep   Analgesia:  Fentanyl   Intra-procedure monitoring:  Blood pressure monitoring, cardiac monitor, continuous capnometry, continuous pulse oximetry, frequent LOC assessments and frequent vital sign checks   Intra-procedure events: none     Total Provider sedation time (minutes):  25 Post-procedure details:    Post-sedation assessment completed:  04/28/2023 9:50 AM   Attendance: Constant attendance by certified staff until patient recovered     Recovery: Patient returned to pre-procedure baseline     Post-sedation assessments completed and reviewed: airway patency, cardiovascular function, mental status and nausea/vomiting     Patient is stable for discharge or admission: yes     Procedure completion:  Tolerated well, no immediate complications Comments:     Prior to sedation, patient had received Ativan, Geodon, fentanyl x 2, likely contributing to prolonged sedation     Gerhard Munch, MD 04/28/23 1112

## 2023-04-28 NOTE — ED Triage Notes (Signed)
Pt comes via [facility] from [facility], pt had witnessed fall by staff, and then witnessed seizure like activity,, bite tongue, combative, possible head injury, possible R shoulder dislocation and R wrist deformity

## 2023-04-28 NOTE — ED Provider Notes (Signed)
[place] EMERGENCY DEPARTMENT AT [facility] Provider Note   CSN: [id number] Arrival date & time: 04/28/23  0600     History  Chief Complaint  Patient presents with   Isabel Koch    Isabel Koch is a 67 y.o. female.  Patient is a 67 year old female with past medical history of dementia, hypothyroidism.  Patient sent from [facility] for evaluation of fall/seizure.  From what EMS reports to me, patient was standing, then fell backward and hit her head on the floor.  There was reported seizure activity either prior to or after the fall.  Patient brought here combative and adding no additional history.  The history is provided by the patient.       Home Medications Prior to Admission medications   Medication Sig Start Date End Date Taking? Authorizing Provider  acetaminophen (TYLENOL) 500 MG tablet Take 1,000 mg by mouth every 12 (twelve) hours as needed.    [provider]  atorvastatin (LIPITOR) 10 MG tablet Take 10 mg by mouth daily.    [provider]  calcium carbonate (TUMS - DOSED IN MG ELEMENTAL CALCIUM) 500 MG chewable tablet Chew 2 tablets by mouth 3 (three) times daily with meals.    [provider]  chlorhexidine (PERIDEX) 0.12 % solution Use as directed 15 mLs in the mouth or throat at bedtime.    [provider]  Cholecalciferol (VITAMIN D3) 50 MCG (2000 UT) TABS Take 1 tablet by mouth daily.    [provider]  citalopram (CELEXA) 20 MG tablet Take 30 mg by mouth daily.    [provider]  Dextromethorphan-guaiFENesin (ROBITUSSIN DM PO) Take by mouth as needed.    [provider]  donepezil (ARICEPT) 10 MG tablet Take 10 mg by mouth at bedtime.    [provider]  estradiol (ESTRACE) 0.1 MG/GM vaginal cream Place 1 Applicatorful vaginally at bedtime.    [provider]  famotidine (PEPCID) 20 MG tablet Take 20 mg by mouth daily.    [provider]  loratadine  (CLARITIN) 10 MG tablet Take 10 mg by mouth daily as needed for allergies.    [provider]  memantine (NAMENDA) 10 MG tablet Take 10 mg by mouth 2 (two) times daily.    [provider]  METAMUCIL FIBER PO Take by mouth daily.    [provider]  methimazole (TAPAZOLE) 5 MG tablet Take 0.5 tablets (2.5 mg total) by mouth daily with breakfast. 03/06/23   Nida, Denman George, MD  nitroGLYCERIN (NITROSTAT) 0.4 MG SL tablet Place 0.4 mg under the tongue every 5 (five) minutes as needed for chest pain.    [provider]  nystatin (MYCOSTATIN/NYSTOP) powder Apply 1 application topically 2 (two) times daily.    [provider]  omeprazole (PRILOSEC) 20 MG capsule Take 20 mg by mouth daily. 10/19/22   [provider]  Sodium Fluoride (SODIUM FLUORIDE 5000 PPM) 1.1 % PSTE Place onto teeth.    [provider]      Allergies    Sulfa antibiotics    Review of Systems   Review of Systems  Unable to perform ROS: Dementia    Physical Exam Updated Vital Signs BP (!) 127/98   Resp (!) 30  Physical Exam Vitals and nursing note reviewed.  Constitutional:      General: She is not in acute distress.    Appearance: She is well-developed. She is not diaphoretic.     Comments: Patient is  awake and alert.  She appears very anxious and agitated.  HENT:     Head: Normocephalic and atraumatic.  Cardiovascular:     Rate and Rhythm: Normal rate and regular rhythm.     Heart sounds: No murmur heard.    No friction rub. No gallop.  Pulmonary:     Effort: Pulmonary effort is normal. No respiratory distress.     Breath sounds: Normal breath sounds. No wheezing.  Abdominal:     General: Bowel sounds are normal. There is no distension.     Palpations: Abdomen is soft.     Tenderness: There is no abdominal tenderness.  Musculoskeletal:        General: Normal range of motion.     Cervical back: Normal range of motion and neck supple.  Skin:     General: Skin is warm and dry.  Neurological:     General: No focal deficit present.     Mental Status: She is alert.     Comments: Patient is awake and alert.  She is agitated and uncooperative.  She does move all extremities, but neurologic exam difficult otherwise secondary to above.     ED Results / Procedures / Treatments   Labs (all labs ordered are listed, but only abnormal results are displayed) Labs Reviewed - No data to display  EKG None  Radiology No results found.  Procedures Procedures  {Document cardiac monitor, telemetry assessment procedure when appropriate:1}  Medications Ordered in ED Medications  LORazepam (ATIVAN) injection 1 mg (has no administration in time range)    ED Course/ Medical Decision Making/ A&P   {   Click here for ABCD2, HEART and other calculatorsREFRESH Note before signing :1}                              Medical Decision Making Amount and/or Complexity of Data Reviewed Labs: ordered. Radiology: ordered.  Risk Prescription drug management.   ***  {Document critical care time when appropriate:1} {Document review of labs and clinical decision tools ie heart score, Chads2Vasc2 etc:1}  {Document your independent review of radiology images, and any outside records:1} {Document your discussion with family members, caretakers, and with consultants:1} {Document social determinants of health affecting pt's care:1} {Document your decision making why or why not admission, treatments were needed:1} Final Clinical Impression(s) / ED Diagnoses Final diagnoses:  None    Rx / DC Orders ED Discharge Orders     None

## 2023-04-28 NOTE — ED Notes (Signed)
Patient transported to CT 

## 2023-04-28 NOTE — ED Notes (Signed)
EDP at bedside doing reduction.

## 2023-04-28 NOTE — Discharge Instructions (Signed)
Monitor your condition carefully and do not hesitate to return here for concerning changes.  Otherwise, it is important to follow-up with our orthopedic surgery colleagues in 1 week in the clinic.

## 2023-05-01 ENCOUNTER — Ambulatory Visit: Payer: Medicare Other | Admitting: Orthopedic Surgery

## 2023-05-01 ENCOUNTER — Telehealth: Payer: Self-pay | Admitting: Orthopedic Surgery

## 2023-05-01 ENCOUNTER — Encounter: Payer: Self-pay | Admitting: Orthopedic Surgery

## 2023-05-01 VITALS — BP 83/45 | HR 87 | Ht 63.0 in | Wt 148.0 lb

## 2023-05-01 DIAGNOSIS — S4291XA Fracture of right shoulder girdle, part unspecified, initial encounter for closed fracture: Secondary | ICD-10-CM

## 2023-05-01 NOTE — Telephone Encounter (Signed)
Dr. Mort Sawyers pt - Samantha at [facility] [phone] lvm stating they need clarification on her PT.  PT now or after 4 weeks, in a sling for 4 weeks.

## 2023-05-01 NOTE — Telephone Encounter (Signed)
Dr. Mort Sawyers pt - Received another vm from [facility], they are needing the office note from today faxed to [phone].

## 2023-05-02 NOTE — Telephone Encounter (Signed)
She had dislocation/ needs sling for 4 weeks then 12 weeks of physical therapy

## 2023-05-02 NOTE — Telephone Encounter (Signed)
Dr. Mort Sawyers pt - Lelon Mast w/[facility] lvm stating she was returning a call, she needs clarification on when to start PT, in sling or when out of sling [phone], get them to page her.

## 2023-05-02 NOTE — Telephone Encounter (Signed)
Not sure, I wasn't in there when he saw her I didn't have anyone to room patients that day, and was putting another patient in a splint,  there is not an office note and not a copy of what he gave her in chart.  Left message for Lelon Mast to call me back. I may need her to read to me what he wrote or fax me a copy

## 2023-05-08 NOTE — Progress Notes (Signed)
Chief Complaint  Patient presents with   Shoulder Pain    Right/ fracture dislocation 04/28/23    67 year old female status post mechanical fall sustained a fracture dislocation of her right glenohumeral joint including the greater tuberosity.  This was relocated in the emergency room with good reduction she is here for follow-up.  She complains of mild pain in the right shoulder and no neurovascular complaints  Review of systems otherwise normal as it relates to this orthopedic problem   has a past medical history of ADHD (attention deficit hyperactivity disorder), Alzheimer disease (HCC), Hypothyroidism, and Urinary retention.    BP (!) 83/45   Pulse 87   Ht 5\' 3"  (1.6 m)   Wt 148 lb (67.1 kg)   BMI 26.22 kg/m   Physical exam overall body habitus normal  Cardiovascular exam of the right upper extremity is also normal  The patient is awake and alert  Mood is pleasant affect is normal  Skin shows no rashes lesions or ulceration  Motor exam shows normal hand function wrist function with tenderness over the proximal humerus without clinical dislocation.  Imaging studies were reviewed the first image shows a fracture dislocation of the right shoulder possible glenoid rim fracture greater tuberosity fracture  The second image shows reduction of both fractures and the joint  Plan immobilization repeat imaging and then physical therapy

## 2023-05-15 ENCOUNTER — Ambulatory Visit: Payer: Medicare Other | Admitting: Orthopedic Surgery

## 2023-05-15 ENCOUNTER — Other Ambulatory Visit (INDEPENDENT_AMBULATORY_CARE_PROVIDER_SITE_OTHER): Payer: Self-pay

## 2023-05-15 DIAGNOSIS — S4291XA Fracture of right shoulder girdle, part unspecified, initial encounter for closed fracture: Secondary | ICD-10-CM | POA: Diagnosis not present

## 2023-05-15 NOTE — Progress Notes (Unsigned)
Chief Complaint  Patient presents with   Shoulder Pain    X ray of the right shoulder follow up   Encounter Diagnosis  Name Primary?   Closed fracture dislocation of joint of right shoulder girdle, initial encounter Yes   Status post closed reduction fracture dislocation right shoulder patient is doing well other than some complaints of pain along the glenohumeral joint  Passive range of motion 80 degrees of abduction 90 degrees of flexion external rotation to 0 degrees with the arm at her side  X-rays show concentric reduction of the glenohumeral joint with reduction of the greater tuberosity fracture  Recommend for physical therapy for the next 6 weeks with passive range of motion to tolerance  Active assisted range of motion and progressive resistance exercises to tolerance  Patient is allowed weightbearing through the right upper extremity as needed

## 2023-06-15 LAB — TSH
TSH: 0.77 (ref 0.41–5.90)
Thyroid Peroxidase Ab: <9
Thyroid Stimulating Immunoglob: <0.1

## 2023-06-21 ENCOUNTER — Encounter: Payer: Self-pay | Admitting: "Endocrinology

## 2023-06-21 ENCOUNTER — Other Ambulatory Visit: Payer: Self-pay

## 2023-06-21 DIAGNOSIS — S4290XD Fracture of unspecified shoulder girdle, part unspecified, subsequent encounter for fracture with routine healing: Secondary | ICD-10-CM | POA: Insufficient documentation

## 2023-06-21 NOTE — Progress Notes (Deleted)
   There were no vitals taken for this visit.  There is no height or weight on file to calculate BMI.  No chief complaint on file.   Encounter Diagnosis  Name Primary?   Closed fracture dislocation of joint of right shoulder girdle with routine healing, subsequent encounter 04/28/23 Yes    DOI/DOS/ Date: 04/28/23  {CHL AMB ORT SYMPTOMS POST TREATMENT:21798}

## 2023-06-25 ENCOUNTER — Encounter: Payer: Medicare Other | Admitting: Orthopedic Surgery

## 2023-06-25 DIAGNOSIS — S4291XD Fracture of right shoulder girdle, part unspecified, subsequent encounter for fracture with routine healing: Secondary | ICD-10-CM

## 2023-06-28 ENCOUNTER — Ambulatory Visit (INDEPENDENT_AMBULATORY_CARE_PROVIDER_SITE_OTHER): Payer: Medicare Other | Admitting: Orthopedic Surgery

## 2023-06-28 VITALS — BP 112/79 | HR 71

## 2023-06-28 DIAGNOSIS — S4291XD Fracture of right shoulder girdle, part unspecified, subsequent encounter for fracture with routine healing: Secondary | ICD-10-CM

## 2023-06-28 NOTE — Progress Notes (Signed)
   BP 112/79   Pulse 71   There is no height or weight on file to calculate BMI.  Chief Complaint  Patient presents with   Fracture    R shoulder DOI 04/28/23    Encounter Diagnosis  Name Primary?   Closed fracture dislocation of joint of right shoulder girdle with routine healing, subsequent encounter 04/28/23 Yes    DOI/DOS/ Date: 04/28/23  Improved

## 2023-06-28 NOTE — Progress Notes (Signed)
Follow-up after proximal humerus fracture of the left  Encounter Diagnosis  Name Primary?   Closed fracture dislocation of joint of right shoulder girdle with routine healing, subsequent encounter 04/28/23 Yes    Patient is in the facility take great she had her therapy not having any pain swelling is gone down she is neurovascularly intact and she will ambulate in the room exam daughter is with has not noticed any new symptoms or worsening pain  Patient has been released

## 2023-07-10 ENCOUNTER — Encounter: Payer: Self-pay | Admitting: "Endocrinology

## 2023-07-10 ENCOUNTER — Ambulatory Visit (INDEPENDENT_AMBULATORY_CARE_PROVIDER_SITE_OTHER): Payer: Medicare Other | Admitting: "Endocrinology

## 2023-07-10 VITALS — BP 114/84 | HR 84 | Ht 63.0 in | Wt 136.0 lb

## 2023-07-10 DIAGNOSIS — E0590 Thyrotoxicosis, unspecified without thyrotoxic crisis or storm: Secondary | ICD-10-CM | POA: Diagnosis not present

## 2023-07-10 DIAGNOSIS — E0510 Thyrotoxicosis with toxic single thyroid nodule without thyrotoxic crisis or storm: Secondary | ICD-10-CM | POA: Diagnosis not present

## 2023-07-10 NOTE — Progress Notes (Signed)
07/10/2023, 4:05 PM  Endocrinology follow-up note   Subjective:    Patient ID: Isabel Koch, female    DOB: Jan 01, 1956, PCP Isabel Koch   Past Medical History:  Diagnosis Date   ADHD (attention deficit hyperactivity disorder)    Alzheimer disease (HCC)    Hypothyroidism    Urinary retention    Past Surgical History:  Procedure Laterality Date   APPENDECTOMY     PUBOVAGINAL SLING N/A 05/12/2014   Procedure: INCISION OF PUBO-VAGINAL SLING;  Surgeon: Isabel Ludwig, MD;  Location: [facility];  Service: Urology;  Laterality: N/A;   VAGINAL HYSTERECTOMY Bilateral 04/30/2014   Procedure: HYSTERECTOMY VAGINAL WITH BILATERAL SALPINGECTOMY;  Surgeon: Isabel Kyle, MD;  Location: [facility];  Service: Gynecology;  Laterality: Bilateral;   VAGINAL PROLAPSE REPAIR N/A 04/30/2014   Procedure: ANTERIOR VAULT REPAIR KELLY PLICATION A CELL GRAFT  AUGMETATION ,SACROSPINUS FIXATION AND LYNX SLING ;  Surgeon: Isabel Ludwig, MD;  Location: [facility];  Service: Urology;  Laterality: N/A;   Social History   Socioeconomic History   Marital status: Divorced    Spouse name: Not on file   Number of children: 3   Years of education: HS    Highest education level: Not on file  Occupational History   Not on file  Tobacco Use   Smoking status: Never   Smokeless tobacco: Never  Vaping Use   Vaping status: Never Used  Substance and Sexual Activity   Alcohol use: Not Currently    Alcohol/week: 2.0 standard drinks of alcohol    Types: 2 Glasses of wine per week   Drug use: No   Sexual activity: Not Currently    Birth control/protection: Surgical    Comment: hyst  Other Topics Concern   Not on file  Social History Narrative   Drinks about 1 cup of coffee a day, rarely drinks coke   Social Drivers of Corporate investment banker Strain: High Risk (07/11/2021)   Overall Financial Resource Strain (CARDIA)     Difficulty of Paying Living Expenses: Very hard  Food Insecurity: No Food Insecurity (07/11/2021)   Hunger Vital Sign    Worried About Running Out of Food in the Last Year: Never true    Ran Out of Food in the Last Year: Never true  Transportation Needs: No Transportation Needs (07/11/2021)   PRAPARE - Administrator, Civil Service (Medical): No    Lack of Transportation (Non-Medical): No  Physical Activity: Sufficiently Active (07/11/2021)   Exercise Vital Sign    Days of Exercise per Week: 4 days    Minutes of Exercise per Session: 60 min  Stress: No Stress Concern Present (07/11/2021)   [employer] of Occupational Health - Occupational Stress Questionnaire    Feeling of Stress : Not at all  Social Connections: Unknown (10/15/2021)   Received from [facility], [facility]   Social Network    Social Network: Not on file   Family History  Problem Relation Age of Onset   Thyroid disease Mother    Thyroid disease Sister    Outpatient Encounter Medications as of 07/10/2023  Medication Sig   acetaminophen (TYLENOL) 500 MG tablet Take 1,000  mg by mouth every 12 (twelve) hours as needed.   atorvastatin (LIPITOR) 10 MG tablet Take 10 mg by mouth daily.   calcium carbonate (TUMS - DOSED IN MG ELEMENTAL CALCIUM) 500 MG chewable tablet Chew 2 tablets by mouth 3 (three) times daily with meals.   chlorhexidine (PERIDEX) 0.12 % solution Use as directed 15 mLs in the mouth or throat at bedtime.   Cholecalciferol (VITAMIN D3) 50 MCG (2000 UT) TABS Take 1 tablet by mouth daily.   citalopram (CELEXA) 20 MG tablet Take 30 mg by mouth daily.   Dextromethorphan-guaiFENesin (ROBITUSSIN DM PO) Take by mouth as needed.   donepezil (ARICEPT) 10 MG tablet Take 10 mg by mouth at bedtime.   estradiol (ESTRACE) 0.1 MG/GM vaginal cream Place 1 Applicatorful vaginally at bedtime.   famotidine (PEPCID) 20 MG tablet Take 20 mg by mouth daily.   loratadine (CLARITIN) 10 MG tablet Take 10 mg by  mouth daily as needed for allergies.   memantine (NAMENDA) 10 MG tablet Take 10 mg by mouth 2 (two) times daily.   METAMUCIL FIBER PO Take by mouth daily.   nitroGLYCERIN (NITROSTAT) 0.4 MG SL tablet Place 0.4 mg under the tongue every 5 (five) minutes as needed for chest pain.   nystatin (MYCOSTATIN/NYSTOP) powder Apply 1 application topically 2 (two) times daily.   omeprazole (PRILOSEC) 20 MG capsule Take 20 mg by mouth daily.   Sodium Fluoride (SODIUM FLUORIDE 5000 PPM) 1.1 % PSTE Place onto teeth.   [DISCONTINUED] methimazole (TAPAZOLE) 5 MG tablet Take 0.5 tablets (2.5 mg total) by mouth daily with breakfast.   No facility-administered encounter medications on file as of 07/10/2023.   ALLERGIES: Allergies  Allergen Reactions   Sulfa Antibiotics Other (See Comments)    Unknown "needles in mouth" per patient     VACCINATION STATUS: Immunization History  Administered Date(s) Administered   PFIZER(Purple Top)SARS-COV-2 Vaccination 04/22/2020   Pfizer Covid-19 Vaccine Bivalent Booster 66yrs & up 05/11/2021    HPI Isabel Koch is 68 y.o. female who presents today with a medical history as above. she is being seen in follow-up after she was seen in consultation for hyperthyroidism requested by patient's nursing home provider.  She is accompanied by her daughter and her caretaker from [facility] nursing and rehabilitation center in [place].  Patient is not optimal historian.  She has deep dementia.   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  She was given an option of low-dose methimazole to treat mild hyperthyroidism.  She is currently on methimazole 2.5 mg p.o. daily with breakfast.  She responded to treatment with her thyroid function tests near target at this time.   She continues to have unexplained weight loss.  Her oral intake is questionable. Difficult to elicit symptoms of  hyperthyroidism from the patient. She denies  palpitations, tremors.  She denies dysphagia, shortness of breath, nor voice change. She has multiple medical problems on polypharmacy.  Review of Systems  Constitutional: + Unintended weight loss of 12 pounds since last visit     Objective:       07/10/2023    1:08 PM 06/28/2023    3:04 PM 05/01/2023   10:50 AM  Vitals with BMI  Height 5\' 3"   5\' 3"   Weight 136 lbs  148 lbs  BMI 24.1  26.22  Systolic 114 112 83  Diastolic 84 79 45  Pulse 84 71 87  BP 114/84   Pulse 84   Ht 5\' 3"  (1.6 m)   Wt 136 lb (61.7 kg)   BMI 24.09 kg/m   Wt Readings from Last 3 Encounters:  07/10/23 136 lb (61.7 kg)  05/01/23 148 lb (67.1 kg)  03/06/23 148 lb 12.8 oz (67.5 kg)    Physical Exam  Constitutional:  Body mass index is 24.09 kg/m.,  + Evident cognitive deficit.    ENT: moist mucous membranes, no gross thyromegaly, no gross cervical lymphadenopathy Cardiovascular: normal precordial activity, Regular Rate and Rhythm, no Murmur/Rubs/Gallops   CMP ( most recent) CMP     Component Value Date/Time   NA 140 04/28/2023 0605   NA 140 09/06/2015 1700   K 3.8 04/28/2023 0605   CL 110 04/28/2023 0605   CO2 19 (L) 04/28/2023 0605   GLUCOSE 161 (H) 04/28/2023 0605   BUN 25 (H) 04/28/2023 0605   BUN 18 09/06/2015 1700   CREATININE 0.91 04/28/2023 0605   CALCIUM 8.6 (L) 04/28/2023 0605   PROT 8.1 09/15/2022 0724   PROT 7.0 09/06/2015 1700   ALBUMIN 3.7 09/15/2022 0724   ALBUMIN 4.2 09/06/2015 1700   AST 33 09/15/2022 0724   ALT 28 09/15/2022 0724   ALKPHOS 66 09/15/2022 0724   BILITOT 0.8 09/15/2022 0724   BILITOT 0.4 09/06/2015 1700   GFRNONAA >60 04/28/2023 0605   GFRAA 99 09/06/2015 1700       Lab Results  Component Value Date   TSH 0.77 06/15/2023   TSH 1.02 03/02/2023   TSH 0.049 (L) 09/15/2022   TSH 0.586 09/06/2015    August 30, 2022 labs indicated TSH was 0.023, total T46.1, free T41.26      Thyroid uptake and  scan on November 24, 2022 FINDINGS: There is a hot nodule in the right side of the isthmus or medial aspect of the right thyroid lobe. There is suppressed uptake throughout the remainder of the thyroid.   4 hour I-123 uptake = 6.1% (normal 5-20%)   24 hour I-123 uptake = 15.2% (normal 10-30%)   IMPRESSION: There is a hot nodule in either the right side of the isthmus with the medial aspect of the right thyroid lobe with suppression of uptake throughout the remainder of the thyroid. The findings are consistent with a hyperfunctioning thyroid nodule.  Assessment & Plan:   1.  Toxic thyroid nodule 2.  Unexplained weight loss  - I have reviewed her new and available thyroid records and clinically evaluated the patient. - Based on these reviews, she has responding to low-dose methimazole.  -She will be kept on same low-dose of methimazole 2.5 mg p.o. daily at breakfast.   She will have repeat thyroid function test before her next visit.      Her previous thyroid uptake and scan confirmed the presence of toxic thyroid adenoma.  She did not need antithyroid ablative radioactive iodine treatment.    Her weight loss is not explainable by her hyperthyroidism which is treated to target at this time.  She does have questionable oral intake likely inadequate.  I suggested one-on-one assistance during mealtimes at the nursing home.  She will return in 6 months with repeat thyroid function tests.  I spent  22  minutes in the care of the patient today including review of labs from Thyroid Function,  and other relevant labs ; imaging/biopsy records (current and previous including abstractions from other facilities); face-to-face time discussing  her lab results and symptoms, medications doses, her options of  short and long term treatment based on the latest standards of care / guidelines;   and documenting the encounter.  Isabel Koch  participated in the discussions, expressed understanding, and  voiced agreement with the above plans.  All questions were answered to her satisfaction. she is encouraged to contact clinic should she have any questions or concerns prior to her return visit.   Follow up plan: Return in about 4 months (around 11/07/2023) for F/U with Pre-visit Labs.   Marquis Lunch, MD [facility] [address] [place], [place] [address] Phone: [phone]  Fax: [phone]     07/10/2023, 4:05 PM  This note was partially dictated with voice recognition software. Similar sounding words can be transcribed inadequately or may not  be corrected upon review.

## 2023-10-27 LAB — LAB REPORT - SCANNED
Free T4: 0.71 ng/dL
TSH: 0.78 (ref 0.41–5.90)

## 2023-11-07 ENCOUNTER — Ambulatory Visit (INDEPENDENT_AMBULATORY_CARE_PROVIDER_SITE_OTHER): Payer: Medicare Other | Admitting: "Endocrinology

## 2023-11-07 ENCOUNTER — Encounter: Payer: Self-pay | Admitting: "Endocrinology

## 2023-11-07 VITALS — BP 100/82 | HR 84 | Ht 63.0 in | Wt 150.6 lb

## 2023-11-07 DIAGNOSIS — E0590 Thyrotoxicosis, unspecified without thyrotoxic crisis or storm: Secondary | ICD-10-CM | POA: Diagnosis not present

## 2023-11-07 NOTE — Progress Notes (Signed)
 11/07/2023, 3:12 PM  Endocrinology follow-up note   Subjective:    Patient ID: Isabel Koch, female    DOB: March 20, 1956, PCP Pcp, No   Past Medical History:  Diagnosis Date   ADHD (attention deficit hyperactivity disorder)    Alzheimer disease (HCC)    Hypothyroidism    Urinary retention    Past Surgical History:  Procedure Laterality Date   APPENDECTOMY     PUBOVAGINAL SLING N/A 05/12/2014   Procedure: INCISION OF PUBO-VAGINAL SLING;  Surgeon: Edmund Gouge, MD;  Location: [facility];  Service: Urology;  Laterality: N/A;   VAGINAL HYSTERECTOMY Bilateral 04/30/2014   Procedure: HYSTERECTOMY VAGINAL WITH BILATERAL SALPINGECTOMY;  Surgeon: Kandra Orn, MD;  Location: [facility];  Service: Gynecology;  Laterality: Bilateral;   VAGINAL PROLAPSE REPAIR N/A 04/30/2014   Procedure: ANTERIOR VAULT REPAIR KELLY PLICATION A CELL GRAFT  AUGMETATION ,SACROSPINUS FIXATION AND LYNX SLING ;  Surgeon: Edmund Gouge, MD;  Location: [facility];  Service: Urology;  Laterality: N/A;   Social History   Socioeconomic History   Marital status: Divorced    Spouse name: Not on file   Number of children: 3   Years of education: HS    Highest education level: Not on file  Occupational History   Not on file  Tobacco Use   Smoking status: Never   Smokeless tobacco: Never  Vaping Use   Vaping status: Never Used  Substance and Sexual Activity   Alcohol use: Not Currently    Alcohol/week: 2.0 standard drinks of alcohol    Types: 2 Glasses of wine per week   Drug use: No   Sexual activity: Not Currently    Birth control/protection: Surgical    Comment: hyst  Other Topics Concern   Not on file  Social History Narrative   Drinks about 1 cup of coffee a day, rarely drinks coke   Social Drivers of Corporate investment banker Strain: High Risk (07/11/2021)   Overall Financial Resource Strain (CARDIA)     Difficulty of Paying Living Expenses: Very hard  Food Insecurity: No Food Insecurity (07/11/2021)   Hunger Vital Sign    Worried About Running Out of Food in the Last Year: Never true    Ran Out of Food in the Last Year: Never true  Transportation Needs: No Transportation Needs (07/11/2021)   PRAPARE - Administrator, Civil Service (Medical): No    Lack of Transportation (Non-Medical): No  Physical Activity: Sufficiently Active (07/11/2021)   Exercise Vital Sign    Days of Exercise per Week: 4 days    Minutes of Exercise per Session: 60 min  Stress: No Stress Concern Present (07/11/2021)   [employer] of Occupational Health - Occupational Stress Questionnaire    Feeling of Stress : Not at all  Social Connections: Unknown (10/15/2021)   Received from [facility]   Social Network    Social Network: Not on file   Family History  Problem Relation Age of Onset   Thyroid  disease Mother    Thyroid  disease Sister    Outpatient Encounter Medications as of 11/07/2023  Medication Sig   acetaminophen  (TYLENOL ) 500 MG tablet Take 1,000  mg by mouth every 12 (twelve) hours as needed.   atorvastatin (LIPITOR) 10 MG tablet Take 10 mg by mouth daily.   calcium carbonate (TUMS - DOSED IN MG ELEMENTAL CALCIUM) 500 MG chewable tablet Chew 2 tablets by mouth 3 (three) times daily with meals.   chlorhexidine  (PERIDEX) 0.12 % solution Use as directed 15 mLs in the mouth or throat at bedtime.   Cholecalciferol (VITAMIN D3) 50 MCG (2000 UT) TABS Take 1 tablet by mouth daily.   citalopram (CELEXA) 20 MG tablet Take 30 mg by mouth daily.   Dextromethorphan-guaiFENesin (ROBITUSSIN DM PO) Take by mouth as needed.   donepezil (ARICEPT) 10 MG tablet Take 10 mg by mouth at bedtime.   estradiol  (ESTRACE ) 0.1 MG/GM vaginal cream Place 1 Applicatorful vaginally at bedtime.   famotidine (PEPCID) 20 MG tablet Take 20 mg by mouth daily.   loratadine (CLARITIN) 10 MG tablet Take 10 mg by  mouth daily as needed for allergies.   memantine (NAMENDA) 10 MG tablet Take 10 mg by mouth 2 (two) times daily.   METAMUCIL FIBER PO Take by mouth daily.   nitroGLYCERIN (NITROSTAT) 0.4 MG SL tablet Place 0.4 mg under the tongue every 5 (five) minutes as needed for chest pain.   nystatin (MYCOSTATIN/NYSTOP) powder Apply 1 application topically 2 (two) times daily.   omeprazole (PRILOSEC) 20 MG capsule Take 20 mg by mouth daily.   Sodium Fluoride (SODIUM FLUORIDE 5000 PPM) 1.1 % PSTE Place onto teeth.   No facility-administered encounter medications on file as of 11/07/2023.   ALLERGIES: Allergies  Allergen Reactions   Sulfa Antibiotics Other (See Comments)    Unknown "needles in mouth" per patient     VACCINATION STATUS: Immunization History  Administered Date(s) Administered   PFIZER(Purple Top)SARS-COV-2 Vaccination 04/22/2020   Pfizer Covid-19 Vaccine Bivalent Booster 77yrs & up 05/11/2021    HPI Isabel Koch is 68 y.o. female who presents today with a medical history as above. she is being seen in follow-up after she was seen in consultation for hyperthyroidism requested by patient's nursing home provider.  She is accompanied by her daughter to clinic to  from [facility] nursing and rehabilitation center in [place].  Patient is not optimal historian.  She has deep dementia.   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  She was given an option of low-dose methimazole  to treat mild hyperthyroidism.  With the evidence of sufficient treatment and mild treatment related hypothyroidism, her methimazole  was discontinued in the interval.   She presents with some weight gain, which she reveals favorably. Her oral intake is now reported to be adequate.    Difficult to elicit symptoms of hyperthyroidism from the patient. She denies  palpitations, tremors.  She denies dysphagia, shortness of breath,  nor voice change. She has multiple medical problems on polypharmacy.  Review of Systems  Constitutional: + Unintended weight loss of 12 pounds since last visit     Objective:       11/07/2023    1:26 PM 07/10/2023    1:08 PM 06/28/2023    3:04 PM  Vitals with BMI  Height 5\' 3"  5\' 3"    Weight 150 lbs 10 oz 136 lbs   BMI 26.68 24.1   Systolic 100 114 782  Diastolic 82 84 79  Pulse 84 84 71    BP 100/82   Pulse 84   Ht 5\' 3"  (1.6 m)  Wt 150 lb 9.6 oz (68.3 kg)   BMI 26.68 kg/m   Wt Readings from Last 3 Encounters:  11/07/23 150 lb 9.6 oz (68.3 kg)  07/10/23 136 lb (61.7 kg)  05/01/23 148 lb (67.1 kg)    Physical Exam  Constitutional:  Body mass index is 26.68 kg/m.,  + Evident cognitive deficit.    ENT: moist mucous membranes, no gross thyromegaly, no gross cervical lymphadenopathy Cardiovascular: normal precordial activity, Regular Rate and Rhythm, no Murmur/Rubs/Gallops   CMP ( most recent) CMP     Component Value Date/Time   NA 140 04/28/2023 0605   NA 140 09/06/2015 1700   K 3.8 04/28/2023 0605   CL 110 04/28/2023 0605   CO2 19 (L) 04/28/2023 0605   GLUCOSE 161 (H) 04/28/2023 0605   BUN 25 (H) 04/28/2023 0605   BUN 18 09/06/2015 1700   CREATININE 0.91 04/28/2023 0605   CALCIUM 8.6 (L) 04/28/2023 0605   PROT 8.1 09/15/2022 0724   PROT 7.0 09/06/2015 1700   ALBUMIN 3.7 09/15/2022 0724   ALBUMIN 4.2 09/06/2015 1700   AST 33 09/15/2022 0724   ALT 28 09/15/2022 0724   ALKPHOS 66 09/15/2022 0724   BILITOT 0.8 09/15/2022 0724   BILITOT 0.4 09/06/2015 1700   GFRNONAA >60 04/28/2023 0605   GFRAA 99 09/06/2015 1700       Lab Results  Component Value Date   TSH 0.78 10/27/2023   TSH 0.77 06/15/2023   TSH 1.02 03/02/2023   TSH 0.049 (L) 09/15/2022   TSH 0.586 09/06/2015   FREET4 0.71 10/27/2023    August 30, 2022 labs indicated TSH was 0.023, total T46.1, free T41.26      Thyroid  uptake and scan on November 24, 2022 FINDINGS: There is a hot nodule  in the right side of the isthmus or medial aspect of the right thyroid  lobe. There is suppressed uptake throughout the remainder of the thyroid .   4 hour I-123 uptake = 6.1% (normal 5-20%)   24 hour I-123 uptake = 15.2% (normal 10-30%)   IMPRESSION: There is a hot nodule in either the right side of the isthmus with the medial aspect of the right thyroid  lobe with suppression of uptake throughout the remainder of the thyroid . The findings are consistent with a hyperfunctioning thyroid  nodule.  Assessment & Plan:   1.  Toxic thyroid  nodule 2.  Unexplained weight loss  - I have reviewed her new and available thyroid  records and clinically evaluated the patient. - Based on these reviews, she has responded to low-dose methimazole  with clinical hypothyroidism.  She is advised to stay off of methimazole  until next measurement in 3-54-month.  If she returns with relapse of hyperthyroidism, she would be reconsidered for low-dose methimazole .      Her previous thyroid  uptake and scan confirmed the presence of toxic thyroid  adenoma.  She did not need antithyroid ablative radioactive iodine treatment.     She does have questionable oral intake likely inadequate.  She is benefiting from one-on-one mealtime assistance in the nursing home.  She returns with favorable weight gain.     She will return in 6 months with repeat thyroid  function tests.   I spent  22  minutes in the care of the patient today including review of labs from Thyroid  Function, CMP, and other relevant labs ; imaging/biopsy records (current and previous including abstractions from other facilities); face-to-face time discussing  her lab results and symptoms, medications doses, her options of short and long term treatment  based on the latest standards of care / guidelines;   and documenting the encounter.  Dearl Fabry  participated in the discussions, expressed understanding, and voiced agreement with the above plans.  All  questions were answered to her satisfaction. she is encouraged to contact clinic should she have any questions or concerns prior to her return visit.   Follow up plan: Return in about 3 months (around 02/07/2024) for F/U with Pre-visit Labs.   Kalvin Orf, MD [facility] [address] [place], [place] [address] Phone: [phone]  Fax: [phone]     11/07/2023, 3:12 PM  This note was partially dictated with voice recognition software. Similar sounding words can be transcribed inadequately or may not  be corrected upon review.

## 2024-02-07 ENCOUNTER — Ambulatory Visit: Admitting: "Endocrinology

## 2024-02-09 LAB — LAB REPORT - SCANNED
Free T4: 0.75 ng/dL
TSH: 0.97 (ref 0.41–5.90)

## 2024-03-03 ENCOUNTER — Ambulatory Visit: Admitting: "Endocrinology

## 2024-03-25 ENCOUNTER — Encounter: Payer: Self-pay | Admitting: "Endocrinology

## 2024-03-25 ENCOUNTER — Ambulatory Visit (INDEPENDENT_AMBULATORY_CARE_PROVIDER_SITE_OTHER): Admitting: "Endocrinology

## 2024-03-25 VITALS — BP 100/76 | HR 84 | Ht 63.0 in | Wt 160.0 lb

## 2024-03-25 DIAGNOSIS — E0590 Thyrotoxicosis, unspecified without thyrotoxic crisis or storm: Secondary | ICD-10-CM

## 2024-03-25 DIAGNOSIS — E0510 Thyrotoxicosis with toxic single thyroid nodule without thyrotoxic crisis or storm: Secondary | ICD-10-CM | POA: Diagnosis not present

## 2024-03-25 NOTE — Progress Notes (Signed)
 03/25/2024, 10:05 AM  Endocrinology follow-up note   Subjective:    Patient ID: Isabel Koch, female    DOB: 1955/12/11, PCP Pcp, No   Past Medical History:  Diagnosis Date   ADHD (attention deficit hyperactivity disorder)    Alzheimer disease (HCC)    Hypothyroidism    Urinary retention    Past Surgical History:  Procedure Laterality Date   APPENDECTOMY     PUBOVAGINAL SLING N/A 05/12/2014   Procedure: INCISION OF PUBO-VAGINAL SLING;  Surgeon: Arlena LILLETTE Gal, MD;  Location: [facility];  Service: Urology;  Laterality: N/A;   VAGINAL HYSTERECTOMY Bilateral 04/30/2014   Procedure: HYSTERECTOMY VAGINAL WITH BILATERAL SALPINGECTOMY;  Surgeon: Dickie DELENA Carder, MD;  Location: [facility];  Service: Gynecology;  Laterality: Bilateral;   VAGINAL PROLAPSE REPAIR N/A 04/30/2014   Procedure: ANTERIOR VAULT REPAIR KELLY PLICATION A CELL GRAFT  AUGMETATION ,SACROSPINUS FIXATION AND LYNX SLING ;  Surgeon: Arlena LILLETTE Gal, MD;  Location: [facility];  Service: Urology;  Laterality: N/A;   Social History   Socioeconomic History   Marital status: Divorced    Spouse name: Not on file   Number of children: 3   Years of education: HS    Highest education level: Not on file  Occupational History   Not on file  Tobacco Use   Smoking status: Never   Smokeless tobacco: Never  Vaping Use   Vaping status: Never Used  Substance and Sexual Activity   Alcohol use: Not Currently    Alcohol/week: 2.0 standard drinks of alcohol    Types: 2 Glasses of wine per week   Drug use: No   Sexual activity: Not Currently    Birth control/protection: Surgical    Comment: hyst  Other Topics Concern   Not on file  Social History Narrative   Drinks about 1 cup of coffee a day, rarely drinks coke   Social Drivers of Corporate investment banker Strain: High Risk (07/11/2021)   Overall Financial Resource Strain (CARDIA)     Difficulty of Paying Living Expenses: Very hard  Food Insecurity: No Food Insecurity (07/11/2021)   Hunger Vital Sign    Worried About Running Out of Food in the Last Year: Never true    Ran Out of Food in the Last Year: Never true  Transportation Needs: No Transportation Needs (07/11/2021)   PRAPARE - Administrator, Civil Service (Medical): No    Lack of Transportation (Non-Medical): No  Physical Activity: Sufficiently Active (07/11/2021)   Exercise Vital Sign    Days of Exercise per Week: 4 days    Minutes of Exercise per Session: 60 min  Stress: No Stress Concern Present (07/11/2021)   [employer] of Occupational Health - Occupational Stress Questionnaire    Feeling of Stress : Not at all  Social Connections: Unknown (10/15/2021)   Received from [facility]   Social Network    Social Network: Not on file   Family History  Problem Relation Age of Onset   Thyroid  disease Mother    Thyroid  disease Sister    Outpatient Encounter Medications as of 03/25/2024  Medication Sig   acetaminophen  (TYLENOL ) 500 MG tablet Take 1,000 mg by  mouth every 12 (twelve) hours as needed.   atorvastatin (LIPITOR) 10 MG tablet Take 10 mg by mouth daily.   calcium carbonate (TUMS - DOSED IN MG ELEMENTAL CALCIUM) 500 MG chewable tablet Chew 2 tablets by mouth 3 (three) times daily with meals.   chlorhexidine  (PERIDEX) 0.12 % solution Use as directed 15 mLs in the mouth or throat at bedtime.   Cholecalciferol (VITAMIN D3) 50 MCG (2000 UT) TABS Take 1 tablet by mouth daily.   citalopram (CELEXA) 20 MG tablet Take 30 mg by mouth daily.   Dextromethorphan-guaiFENesin (ROBITUSSIN DM PO) Take by mouth as needed.   donepezil (ARICEPT) 10 MG tablet Take 10 mg by mouth at bedtime.   estradiol  (ESTRACE ) 0.1 MG/GM vaginal cream Place 1 Applicatorful vaginally at bedtime.   famotidine (PEPCID) 20 MG tablet Take 20 mg by mouth daily.   loratadine (CLARITIN) 10 MG tablet Take 10 mg by mouth daily as  needed for allergies.   memantine (NAMENDA) 10 MG tablet Take 10 mg by mouth 2 (two) times daily.   METAMUCIL FIBER PO Take by mouth daily.   nitroGLYCERIN (NITROSTAT) 0.4 MG SL tablet Place 0.4 mg under the tongue every 5 (five) minutes as needed for chest pain.   nystatin (MYCOSTATIN/NYSTOP) powder Apply 1 application topically 2 (two) times daily.   omeprazole (PRILOSEC) 20 MG capsule Take 20 mg by mouth daily.   Sodium Fluoride (SODIUM FLUORIDE 5000 PPM) 1.1 % PSTE Place onto teeth.   No facility-administered encounter medications on file as of 03/25/2024.   ALLERGIES: Allergies  Allergen Reactions   Sulfa Antibiotics Other (See Comments)    Unknown needles in mouth per patient     VACCINATION STATUS: Immunization History  Administered Date(s) Administered   PFIZER(Purple Top)SARS-COV-2 Vaccination 04/22/2020   Pfizer Covid-19 Vaccine Bivalent Booster 68yrs & up 05/11/2021    HPI Isabel Koch is 68 y.o. female who presents today with a medical history as above. she is being seen in follow-up after she was seen in consultation for hyperthyroidism requested by patient's nursing home provider.  She is accompanied by her daughter to clinic to  from [facility] in [place].  Patient is not optimal historian.  She has deep dementia.   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  She was briefly treated with methimazole  with treatment effect to euthyroid state.  She is not on antithyroid intervention at this time.  She has no new complaints.    She presents with some weight gain, which she reveals favorably. Her oral intake is now reported to be adequate.    Difficult to elicit symptoms of hyperthyroidism from the patient. She denies  palpitations, tremors.  She denies dysphagia, shortness of breath, nor voice change. She has multiple medical problems on  polypharmacy.  Review of Systems  Constitutional: + Unintended weight loss of 12 pounds since last visit     Objective:       03/25/2024    9:45 AM 11/07/2023    1:26 PM 07/10/2023    1:08 PM  Vitals with BMI  Height 5' 3 5' 3 5' 3  Weight 160 lbs 150 lbs 10 oz 136 lbs  BMI 28.35 26.68 24.1  Systolic 100 100 885  Diastolic 76 82 84  Pulse 84 84 84    BP 100/76   Pulse 84   Ht 5' 3 (1.6 m)  Wt 160 lb (72.6 kg)   BMI 28.34 kg/m   Wt Readings from Last 3 Encounters:  03/25/24 160 lb (72.6 kg)  11/07/23 150 lb 9.6 oz (68.3 kg)  07/10/23 136 lb (61.7 kg)    Physical Exam  Constitutional:  Body mass index is 28.34 kg/m.,  + Evident cognitive deficit.    ENT: moist mucous membranes, no gross thyromegaly, no gross cervical lymphadenopathy Cardiovascular: normal precordial activity, Regular Rate and Rhythm, no Murmur/Rubs/Gallops   CMP ( most recent) CMP     Component Value Date/Time   NA 140 04/28/2023 0605   NA 140 09/06/2015 1700   K 3.8 04/28/2023 0605   CL 110 04/28/2023 0605   CO2 19 (L) 04/28/2023 0605   GLUCOSE 161 (H) 04/28/2023 0605   BUN 25 (H) 04/28/2023 0605   BUN 18 09/06/2015 1700   CREATININE 0.91 04/28/2023 0605   CALCIUM 8.6 (L) 04/28/2023 0605   PROT 8.1 09/15/2022 0724   PROT 7.0 09/06/2015 1700   ALBUMIN 3.7 09/15/2022 0724   ALBUMIN 4.2 09/06/2015 1700   AST 33 09/15/2022 0724   ALT 28 09/15/2022 0724   ALKPHOS 66 09/15/2022 0724   BILITOT 0.8 09/15/2022 0724   BILITOT 0.4 09/06/2015 1700   GFRNONAA >60 04/28/2023 0605   GFRAA 99 09/06/2015 1700       Lab Results  Component Value Date   TSH 0.97 02/09/2024   TSH 0.78 10/27/2023   TSH 0.77 06/15/2023   TSH 1.02 03/02/2023   TSH 0.049 (L) 09/15/2022   FREET4 0.75 02/09/2024   FREET4 0.71 10/27/2023    August 30, 2022 labs indicated TSH was 0.023, total T46.1, free T41.26      Thyroid  uptake and scan on November 24, 2022 FINDINGS: There is a hot nodule in the right side of  the isthmus or medial aspect of the right thyroid  lobe. There is suppressed uptake throughout the remainder of the thyroid .   4 hour I-123 uptake = 6.1% (normal 5-20%)   24 hour I-123 uptake = 15.2% (normal 10-30%)   IMPRESSION: There is a hot nodule in either the right side of the isthmus with the medial aspect of the right thyroid  lobe with suppression of uptake throughout the remainder of the thyroid . The findings are consistent with a hyperfunctioning thyroid  nodule.  March 2015 fine-needle aspiration biopsy of thyroid  nodule  THYROID , FINE NEEDLE ASPIRATION, LLP (SPECIMEN 1 OF 1, COLLECTED ON 08/12/13): BENIGN. (BETHESDA CLASS II). FINDINGS CONSISTENT WITH BENIGN FOLLICULAR NODULE.SABRA  Assessment & Plan:   1.  Toxic thyroid  nodule   - I have reviewed her new and available thyroid  records and clinically evaluated the patient. - Based on these reviews, she has responded to low-dose methimazole  with clinical hypothyroidism.  She is advised to stay off of methimazole  until next measurement in 3-82-month.  If she returns with relapse of hyperthyroidism, she would be reconsidered for low-dose methimazole .      Her previous thyroid  uptake and scan confirmed the presence of toxic thyroid  adenoma.  She did not need antithyroid ablative radioactive iodine treatment.  Her daughter was not aware of her mother's thyroid  nodule history.  She did have fine-needle aspiration biopsy which was negative in 2015.  She is offered a surveillance repeat thyroid  ultrasound before her next visit.  She would continue to benefit from one-on-one mealtime assistance in the nursing home.  She returns with favorable weight gain.     She will return in 6 months with repeat thyroid  function  tests.   I spent  20  minutes in the care of the patient today including review of labs from Thyroid  Function, CMP, and other relevant labs ; imaging/biopsy records (current and previous including abstractions from other  facilities); face-to-face time discussing  her lab results and symptoms, medications doses, her options of short and long term treatment based on the latest standards of care / guidelines;   and documenting the encounter.  Isabel Koch  participated in the discussions, expressed understanding, and voiced agreement with the above plans.  All questions were answered to her satisfaction. she is encouraged to contact clinic should she have any questions or concerns prior to her return visit.   Follow up plan: Return in about 3 months (around 06/25/2024) for F/U with Pre-visit Labs, Thyroid  / Neck Ultrasound.   Ranny Earl, MD [facility] [address] [place], [place] [address] Phone: [phone]  Fax: [phone]     03/25/2024, 10:05 AM  This note was partially dictated with voice recognition software. Similar sounding words can be transcribed inadequately or may not  be corrected upon review.

## 2024-04-02 ENCOUNTER — Ambulatory Visit (HOSPITAL_COMMUNITY)
Admission: RE | Admit: 2024-04-02 | Discharge: 2024-04-02 | Disposition: A | Discharge status: Home / Self Care | Source: Ambulatory Visit | Attending: "Endocrinology | Admitting: "Endocrinology

## 2024-04-02 DIAGNOSIS — E0510 Thyrotoxicosis with toxic single thyroid nodule without thyrotoxic crisis or storm: Secondary | ICD-10-CM | POA: Insufficient documentation

## 2024-05-23 ENCOUNTER — Other Ambulatory Visit (HOSPITAL_COMMUNITY): Payer: Self-pay | Admitting: Emergency Medicine

## 2024-05-23 DIAGNOSIS — R9389 Abnormal findings on diagnostic imaging of other specified body structures: Secondary | ICD-10-CM

## 2024-05-27 ENCOUNTER — Ambulatory Visit (HOSPITAL_COMMUNITY)
Admission: RE | Admit: 2024-05-27 | Discharge: 2024-05-27 | Attending: Emergency Medicine | Admitting: Emergency Medicine

## 2024-05-27 DIAGNOSIS — R9389 Abnormal findings on diagnostic imaging of other specified body structures: Secondary | ICD-10-CM

## 2024-05-27 DIAGNOSIS — E041 Nontoxic single thyroid nodule: Secondary | ICD-10-CM | POA: Diagnosis not present

## 2024-05-27 MED ORDER — IOHEXOL 300 MG/ML  SOLN
100.0000 mL | Freq: Once | INTRAMUSCULAR | Status: AC | PRN
  Administered 2024-05-27: 11:00:00 100 mL via INTRAVENOUS

## 2024-06-11 ENCOUNTER — Telehealth: Payer: Self-pay

## 2024-06-11 NOTE — Telephone Encounter (Signed)
 Dr. Lenis Lukes- RN from [facility] called wanting to inform us  that patient had recent imaging studies CT Neck that showed Thyroid  Nodule had increase in size and wanted to know if patient needed to be seen sooner than her appt on 07/02/2024. Please let us  know if you want to move appt to something sooner. Thank you.

## 2024-06-11 NOTE — Telephone Encounter (Signed)
 Called and notified Samatha form [facility] regarding appt in January is okay no sooner appt required.

## 2024-07-02 ENCOUNTER — Encounter: Payer: Self-pay | Admitting: "Endocrinology

## 2024-07-02 ENCOUNTER — Ambulatory Visit (INDEPENDENT_AMBULATORY_CARE_PROVIDER_SITE_OTHER): Admitting: "Endocrinology

## 2024-07-02 VITALS — BP 98/66 | HR 84 | Ht 63.0 in | Wt 165.0 lb

## 2024-07-02 DIAGNOSIS — E0510 Thyrotoxicosis with toxic single thyroid nodule without thyrotoxic crisis or storm: Secondary | ICD-10-CM | POA: Diagnosis not present

## 2024-07-02 DIAGNOSIS — R7989 Other specified abnormal findings of blood chemistry: Secondary | ICD-10-CM | POA: Diagnosis not present

## 2024-07-02 DIAGNOSIS — E0590 Thyrotoxicosis, unspecified without thyrotoxic crisis or storm: Secondary | ICD-10-CM
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
## 2024-07-03 ENCOUNTER — Other Ambulatory Visit: Payer: Self-pay | Admitting: "Endocrinology

## 2024-07-03 ENCOUNTER — Telehealth: Payer: Self-pay

## 2024-07-03 LAB — TSH: TSH: 0.757 u[IU]/mL (ref 0.450–4.500)

## 2024-07-03 LAB — T4, FREE: Free T4: 0.67 ng/dL — ABNORMAL LOW (ref 0.82–1.77)

## 2024-07-03 LAB — T3: T3, Total: 100 ng/dL (ref 71–180)

## 2024-07-03 MED ORDER — LEVOTHYROXINE SODIUM 25 MCG PO TABS: 25.0000 ug | ORAL_TABLET | Freq: Every day | ORAL | 1 refills | Status: AC

## 2024-07-03 NOTE — Telephone Encounter (Signed)
-----   Message from Ethelle Earl, MD sent at 07/03/2024  8:44 AM EST ----- Oops.. Sima. ----- Message ----- From: Waddell Laymon CROME Sent: 07/03/2024   8:40 AM EST To: Ethelle LELON Earl, MD  Who is the pt? ----- Message ----- From: Earl Ethelle LELON, MD Sent: 07/03/2024   8:23 AM EST To: Laymon CROME Waddell; Zada MARLA Sharps, LPN  Eugenie Harewood and Brittany, This patient's labs are showing hypothyroidism. I would like to start her on Levothyroxine  25 mcg po daily. I sent the rx to her pharm. I want her to return with labs in 3 months for a follow up, does not need the thyroid  ultrasound until next year.

## 2024-07-03 NOTE — Telephone Encounter (Signed)
 Spoke with pt's sister Nyla) making her aware pt's lab results show hypothyroidism and Dr. Lenis would like her to start taking Levothyroxine  25mcg daily and return for an appt. In 3 months with lab work to be drawn one week ahead of appt. Per Dr. Lenis. Also made her aware pt would not need a thyroid  ultrasound until next year per Dr. Lenis. Pt's sister voiced understanding. I left a message for Belinda with [facility] requesting a return call to the office.

## 2024-07-03 NOTE — Telephone Encounter (Signed)
 Sch'd 3 month f/u with Belinda at [facility]

## 2024-07-04 ENCOUNTER — Telehealth: Payer: Self-pay

## 2024-07-04 NOTE — Telephone Encounter (Signed)
-----   Message from Ethelle Earl, MD sent at 07/03/2024  8:44 AM EST ----- Oops.. [name]. ----- Message ----- From: Waddell Laymon CROME Sent: 07/03/2024   8:40 AM EST To: Ethelle LELON Earl, MD  Who is the pt? ----- Message ----- From: Earl Ethelle LELON, MD Sent: 07/03/2024   8:23 AM EST To: Laymon CROME Waddell; Zada MARLA Sharps, LPN  Isabel Koch and Brittany, This patient's labs are showing hypothyroidism. I would like to start her on Levothyroxine  25 mcg po daily. I sent the rx to her pharm. I want her to return with labs in 3 months for a follow up, does not need the thyroid  ultrasound until next year.

## 2024-07-04 NOTE — Telephone Encounter (Signed)
 Spoke with pt's nurse from [facility] advising her pt's labs showing hypothyroidism and pt needs to start taking Levothyroxine  25mcg daily 30 minutes prior to eating or taking any other medication and return for an appt in 3 months with labs and pt does not need a thyroid  ultrasound until next year per Dr. Barbette orders. Made them aware a Rx for Levothyroxine  25mcg daily had been sent in to the pharmacy. Understanding voiced.

## 2024-08-15 ENCOUNTER — Ambulatory Visit (HOSPITAL_COMMUNITY)

## 2024-10-07 ENCOUNTER — Ambulatory Visit: Admitting: "Endocrinology

## 2025-07-02 ENCOUNTER — Ambulatory Visit: Admitting: "Endocrinology
